# Patient Record
Sex: Male | Born: 1955
Health system: Southern US, Community
[De-identification: ages and names within clinical notes are randomized; demographics above are authoritative.]

## PROBLEM LIST (undated history)

## (undated) DIAGNOSIS — R233 Spontaneous ecchymoses: Secondary | ICD-10-CM

## (undated) DIAGNOSIS — R635 Abnormal weight gain: Secondary | ICD-10-CM

## (undated) DIAGNOSIS — E785 Hyperlipidemia, unspecified: Secondary | ICD-10-CM

## (undated) DIAGNOSIS — I2510 Atherosclerotic heart disease of native coronary artery without angina pectoris: Secondary | ICD-10-CM

## (undated) DIAGNOSIS — R238 Other skin changes: Secondary | ICD-10-CM

## (undated) HISTORY — DX: Other skin changes: R23.8

## (undated) HISTORY — DX: Abnormal weight gain: R63.5

## (undated) HISTORY — DX: Atherosclerotic heart disease of native coronary artery without angina pectoris: I25.10

## (undated) HISTORY — DX: Spontaneous ecchymoses: R23.3

## (undated) HISTORY — DX: Hyperlipidemia, unspecified: E78.5

---

## 2005-12-03 ENCOUNTER — Ambulatory Visit: Payer: Self-pay | Admitting: Family Medicine

## 2006-01-06 ENCOUNTER — Ambulatory Visit: Payer: Self-pay | Admitting: Family Medicine

## 2006-06-15 DIAGNOSIS — E785 Hyperlipidemia, unspecified: Secondary | ICD-10-CM

## 2006-11-09 ENCOUNTER — Encounter (INDEPENDENT_AMBULATORY_CARE_PROVIDER_SITE_OTHER): Payer: Self-pay | Admitting: Family Medicine

## 2007-01-12 ENCOUNTER — Ambulatory Visit: Payer: Self-pay | Admitting: Family Medicine

## 2007-01-12 DIAGNOSIS — F528 Other sexual dysfunction not due to a substance or known physiological condition: Secondary | ICD-10-CM

## 2007-01-12 DIAGNOSIS — K625 Hemorrhage of anus and rectum: Secondary | ICD-10-CM

## 2007-01-14 LAB — CONVERTED CEMR LAB
AST: 37 units/L (ref 0–37)
Basophils Absolute: 0 10*3/uL (ref 0.0–0.1)
Cholesterol: 262 mg/dL (ref 0–200)
Eosinophils Absolute: 0.1 10*3/uL (ref 0.0–0.6)
Glucose, Bld: 103 mg/dL — ABNORMAL HIGH (ref 70–99)
HDL: 36.4 mg/dL — ABNORMAL LOW (ref >39.0)
MCHC: 34.1 g/dL (ref 30.0–36.0)
MCV: 90.2 fL (ref 78.0–100.0)
Neutro Abs: 3.3 10*3/uL (ref 1.4–7.7)
Neutrophils Relative %: 54.9 % (ref 43.0–77.0)
Platelets: 174 10*3/uL (ref 150–400)
RBC: 4.51 M/uL (ref 4.22–5.81)
Triglycerides: 183 mg/dL — ABNORMAL HIGH (ref 0–149)

## 2007-01-15 ENCOUNTER — Telehealth (INDEPENDENT_AMBULATORY_CARE_PROVIDER_SITE_OTHER): Payer: Self-pay | Admitting: *Deleted

## 2007-01-25 ENCOUNTER — Ambulatory Visit: Payer: Self-pay | Admitting: Internal Medicine

## 2007-01-26 ENCOUNTER — Telehealth (INDEPENDENT_AMBULATORY_CARE_PROVIDER_SITE_OTHER): Payer: Self-pay | Admitting: *Deleted

## 2007-03-30 ENCOUNTER — Telehealth (INDEPENDENT_AMBULATORY_CARE_PROVIDER_SITE_OTHER): Payer: Self-pay | Admitting: *Deleted

## 2007-05-04 ENCOUNTER — Encounter (INDEPENDENT_AMBULATORY_CARE_PROVIDER_SITE_OTHER): Payer: Self-pay | Admitting: Family Medicine

## 2007-05-10 ENCOUNTER — Encounter (INDEPENDENT_AMBULATORY_CARE_PROVIDER_SITE_OTHER): Payer: Self-pay | Admitting: *Deleted

## 2007-10-27 ENCOUNTER — Ambulatory Visit: Payer: Self-pay | Admitting: Internal Medicine

## 2007-11-10 ENCOUNTER — Ambulatory Visit: Payer: Self-pay | Admitting: Internal Medicine

## 2007-11-10 ENCOUNTER — Encounter: Payer: Self-pay | Admitting: Internal Medicine

## 2007-11-19 ENCOUNTER — Encounter: Payer: Self-pay | Admitting: Internal Medicine

## 2008-06-07 ENCOUNTER — Ambulatory Visit: Payer: Self-pay | Admitting: Family Medicine

## 2008-06-12 ENCOUNTER — Telehealth (INDEPENDENT_AMBULATORY_CARE_PROVIDER_SITE_OTHER): Payer: Self-pay | Admitting: *Deleted

## 2008-06-12 LAB — CONVERTED CEMR LAB
ALT: 91 units/L — ABNORMAL HIGH (ref 0–53)
AST: 43 units/L — ABNORMAL HIGH (ref 0–37)
Alkaline Phosphatase: 93 units/L (ref 39–117)
Basophils Absolute: 0 10*3/uL (ref 0.0–0.1)
Bilirubin, Direct: 0.1 mg/dL (ref 0.0–0.3)
CO2: 29 meq/L (ref 19–32)
Calcium: 9.6 mg/dL (ref 8.4–10.5)
Chloride: 102 meq/L (ref 96–112)
Glucose, Bld: 102 mg/dL — ABNORMAL HIGH (ref 70–99)
Hemoglobin: 15 g/dL (ref 13.0–17.0)
LDL Cholesterol: 142 mg/dL — ABNORMAL HIGH (ref 0–99)
Lymphocytes Relative: 28.2 % (ref 12.0–46.0)
Monocytes Relative: 13.7 % — ABNORMAL HIGH (ref 3.0–12.0)
Neutro Abs: 3 10*3/uL (ref 1.4–7.7)
Neutrophils Relative %: 56.1 % (ref 43.0–77.0)
Potassium: 4.5 meq/L (ref 3.5–5.1)
RDW: 13.9 % (ref 11.5–14.6)
Sodium: 138 meq/L (ref 135–145)
Total Bilirubin: 1.3 mg/dL — ABNORMAL HIGH (ref 0.3–1.2)
Total CHOL/HDL Ratio: 6
Total Protein: 7.9 g/dL (ref 6.0–8.3)
Triglycerides: 102 mg/dL (ref 0–149)
VLDL: 20 mg/dL (ref 0–40)

## 2008-06-29 ENCOUNTER — Ambulatory Visit: Payer: Self-pay | Admitting: Family Medicine

## 2008-07-03 ENCOUNTER — Encounter (INDEPENDENT_AMBULATORY_CARE_PROVIDER_SITE_OTHER): Payer: Self-pay | Admitting: *Deleted

## 2008-07-03 LAB — CONVERTED CEMR LAB
ALT: 54 units/L — ABNORMAL HIGH (ref 0–53)
AST: 23 units/L (ref 0–37)
Albumin: 4 g/dL (ref 3.5–5.2)
Alkaline Phosphatase: 91 units/L (ref 39–117)
Indirect Bilirubin: 0.3 mg/dL (ref 0.0–0.9)
Total Protein: 7.3 g/dL (ref 6.0–8.3)

## 2008-11-02 DIAGNOSIS — R740 Nonspecific elevation of levels of transaminase and lactic acid dehydrogenase [LDH]: Secondary | ICD-10-CM

## 2010-01-17 ENCOUNTER — Ambulatory Visit: Payer: Self-pay | Admitting: Family Medicine

## 2010-01-17 DIAGNOSIS — L84 Corns and callosities: Secondary | ICD-10-CM | POA: Insufficient documentation

## 2010-01-17 DIAGNOSIS — D485 Neoplasm of uncertain behavior of skin: Secondary | ICD-10-CM

## 2010-01-18 ENCOUNTER — Telehealth (INDEPENDENT_AMBULATORY_CARE_PROVIDER_SITE_OTHER): Payer: Self-pay | Admitting: *Deleted

## 2010-01-18 LAB — CONVERTED CEMR LAB
ALT: 31 units/L (ref 0–53)
AST: 24 units/L (ref 0–37)
Albumin: 4 g/dL (ref 3.5–5.2)
BUN: 14 mg/dL (ref 6–23)
Chloride: 105 meq/L (ref 96–112)
Eosinophils Relative: 1.2 % (ref 0.0–5.0)
GFR calc non Af Amer: 78.83 mL/min (ref >60)
Glucose, Bld: 97 mg/dL (ref 70–99)
HCT: 42.6 % (ref 39.0–52.0)
HDL: 41.6 mg/dL (ref >39.00)
Hemoglobin: 14.6 g/dL (ref 13.0–17.0)
Lymphs Abs: 1.4 10*3/uL (ref 0.7–4.0)
MCV: 90.2 fL (ref 78.0–100.0)
Monocytes Absolute: 0.6 10*3/uL (ref 0.1–1.0)
Monocytes Relative: 9.4 % (ref 3.0–12.0)
Neutro Abs: 3.9 10*3/uL (ref 1.4–7.7)
PSA: 0.6 ng/mL (ref 0.10–4.00)
Platelets: 156 10*3/uL (ref 150.0–400.0)
Potassium: 4.7 meq/L (ref 3.5–5.1)
RDW: 15.9 % — ABNORMAL HIGH (ref 11.5–14.6)
Sodium: 141 meq/L (ref 135–145)
TSH: 1.88 microintl units/mL (ref 0.35–5.50)

## 2010-01-23 ENCOUNTER — Telehealth (INDEPENDENT_AMBULATORY_CARE_PROVIDER_SITE_OTHER): Payer: Self-pay | Admitting: *Deleted

## 2010-01-23 ENCOUNTER — Encounter (INDEPENDENT_AMBULATORY_CARE_PROVIDER_SITE_OTHER): Payer: Self-pay | Admitting: *Deleted

## 2010-03-31 HISTORY — PX: CORONARY STENT PLACEMENT: SHX1402

## 2010-04-25 ENCOUNTER — Encounter: Payer: Self-pay | Admitting: Family Medicine

## 2010-04-25 ENCOUNTER — Ambulatory Visit
Admission: RE | Admit: 2010-04-25 | Discharge: 2010-04-25 | Payer: Self-pay | Source: Home / Self Care | Attending: Cardiology | Admitting: Cardiology

## 2010-04-25 ENCOUNTER — Encounter: Payer: Self-pay | Admitting: Cardiology

## 2010-04-25 ENCOUNTER — Ambulatory Visit
Admission: RE | Admit: 2010-04-25 | Discharge: 2010-04-25 | Payer: Self-pay | Source: Home / Self Care | Attending: Family Medicine | Admitting: Family Medicine

## 2010-04-26 ENCOUNTER — Ambulatory Visit (HOSPITAL_COMMUNITY)
Admission: RE | Admit: 2010-04-26 | Discharge: 2010-04-27 | Payer: Self-pay | Source: Home / Self Care | Attending: Internal Medicine | Admitting: Internal Medicine

## 2010-04-26 LAB — CONVERTED CEMR LAB
Basophils Absolute: 0 10*3/uL (ref 0.0–0.1)
Basophils Relative: 0 % (ref 0–1)
CO2: 29 meq/L (ref 19–32)
Calcium: 9.5 mg/dL (ref 8.4–10.5)
Creatinine, Ser: 1.26 mg/dL (ref 0.40–1.50)
Eosinophils Absolute: 0.1 10*3/uL (ref 0.0–0.7)
INR: 0.91 (ref <1.50)
MCHC: 33.1 g/dL (ref 30.0–36.0)
MCV: 89.2 fL (ref 78.0–100.0)
Neutro Abs: 4 10*3/uL (ref 1.7–7.7)
Neutrophils Relative %: 56 % (ref 43–77)
Platelets: 168 10*3/uL (ref 150–400)
Prothrombin Time: 12.5 s (ref 11.6–15.2)
RDW: 14.8 % (ref 11.5–15.5)
Sodium: 140 meq/L (ref 135–145)

## 2010-04-26 LAB — POCT ACTIVATED CLOTTING TIME: Activated Clotting Time: 346 seconds

## 2010-04-27 LAB — BASIC METABOLIC PANEL
Calcium: 9.3 mg/dL (ref 8.4–10.5)
GFR calc Af Amer: >60 mL/min (ref >60)
GFR calc non Af Amer: 58 mL/min — ABNORMAL LOW (ref >60)
Sodium: 137 mEq/L (ref 135–145)

## 2010-04-27 LAB — CBC
HCT: 42.4 % (ref 39.0–52.0)
Hemoglobin: 14.2 g/dL (ref 13.0–17.0)
MCH: 29.8 pg (ref 26.0–34.0)
MCHC: 33.5 g/dL (ref 30.0–36.0)
MCV: 89.1 fL (ref 78.0–100.0)
Platelets: 155 10*3/uL (ref 150–400)
RBC: 4.76 MIL/uL (ref 4.22–5.81)
RDW: 15 % (ref 11.5–15.5)
WBC: 7.1 10*3/uL (ref 4.0–10.5)

## 2010-04-27 LAB — PLATELET INHIBITION P2Y12
P2Y12 % Inhibition: 0 %
Platelet Function  P2Y12: 229 [PRU] (ref 194–418)
Platelet Function Baseline: 221 [PRU] (ref 194–418)

## 2010-04-30 NOTE — Progress Notes (Signed)
Summary: clarification from pharmacy for Viagra refill  Phone Note Refill Request Message from:  Fax from Pharmacy on January 23, 2010 11:12 AM  Refills Requested: Medication #1:  VIAGRA 100 MG TABS 1 tab by mouth as needed received clarifaication fax from [facility]--states "attached prescriptions was faxed to [place].  We were unable to determine if fax came from your office. "     will take this paper over to [facility]  Initial call taken by: Jerolyn Shin,  January 23, 2010 11:16 AM  Follow-up for Phone Call        prescription faxed to [facility].......Marland KitchenDoristine Devoid CMA  January 23, 2010 2:33 PM     Prescriptions: VIAGRA 100 MG TABS (SILDENAFIL CITRATE) 1 tab by mouth as needed  #3 months x 3   Entered by:   Doristine Devoid CMA   Authorized by:   Neena Rhymes MD   Signed by:   Doristine Devoid CMA on 01/23/2010   Method used:   Reprint   RxID:   [id number]

## 2010-04-30 NOTE — Assessment & Plan Note (Signed)
Summary: cpx/fasting//kn   Vital Signs:  Patient profile:   55 year old male Height:      70 inches Weight:      214 pounds BMI:     30.82 Pulse rate:   80 / minute BP sitting:   124 / 84  (left arm)  Vitals Entered By: Doristine Devoid CMA (January 17, 2010 8:24 AM) CC: CPX AND LABS   History of Present Illness: 55 yo man here today for CPE.  no concerns about health.  UTD on colonoscopy.  Callous- on bottom of L foot.  will get painful if it overgrows.  is filing area regularly.  doesn't want area 'cut out'.  wants to know if there are other options.  Preventive Screening-Counseling & Management  Alcohol-Tobacco     Alcohol drinks/day: <1     Smoking Status: never  Caffeine-Diet-Exercise     Diet Comments: weight watchers     Does Patient Exercise: yes     Type of exercise: treadmill      Sexual History:  currently monogamous.        Drug Use:  never.    Current Medications (verified): 1)  Viagra 100 Mg Tabs (Sildenafil Citrate) .Marland Kitchen.. 1 Tab By Mouth As Needed 2)  Vitamin C 500 Mg Tabs (Ascorbic Acid) .... Take One Tablet Daily 3)  Vitamin D .... Take One Tablet Daily 4)  Multivitamins  Caps (Multiple Vitamin) .... 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  Past Medical History: Reviewed history from 06/15/2006 and no changes required. Hyperlipidemia  Past Surgical History: Reviewed history from 06/07/2008 and no changes required. none  Family History: Reviewed history from 06/07/2008 and no changes  required. Family History Hypertension mother, brother, sister- DM no hx of cancer  Social History: Reviewed history from 06/07/2008 and no changes required. 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    Physical Exam  General:  Well-developed,well-nourished,in no acute distress; alert,appropriate and cooperative throughout examination Head:  Normocephalic and atraumatic without obvious abnormalities. Eyes:  No corneal or conjunctival inflammation noted. EOMI. Perrla. Funduscopic exam benign, without hemorrhages, exudates or papilledema. Vision grossly normal. Ears:  External ear exam shows no significant lesions or deformities.  Otoscopic examination reveals clear canals, tympanic membranes are intact bilaterally without bulging, retraction, inflammation or discharge. Hearing is grossly normal bilaterally. Nose:  External nasal examination shows no deformity or inflammation. Nasal mucosa are pink and moist without lesions or exudates. Mouth:  Oral mucosa and oropharynx without lesions or exudates.  Teeth in good repair. Neck:  No deformities, masses, or tenderness noted. Lungs:  Normal respiratory effort, chest expands symmetrically. Lungs are clear to auscultation, no crackles or wheezes. Heart:  Normal rate and regular rhythm. S1 and S2 normal without gallop, murmur, click, rub or other extra sounds. Abdomen:  Bowel  sounds positive,abdomen soft and non-tender without masses, organomegaly or hernias noted. Rectal:  No external  abnormalities noted. Normal sphincter tone. No rectal masses or tenderness. Genitalia:  Testes bilaterally descended without nodularity, tenderness or masses. No scrotal masses or lesions. No penis lesions or urethral discharge. Prostate:  Prostate gland firm and smooth, no enlargement, nodularity, tenderness, mass, asymmetry or induration. Pulses:  +2 carotid, radial, DP Extremities:  No clubbing, cyanosis, edema, or deformity noted with normal full range of motion of all joints.   corn located on plantar surface of L foot- no evidence of infxn Neurologic:  No cranial nerve deficits noted. Station and gait are normal. Plantar reflexes are down-going bilaterally. DTRs are symmetrical throughout. Sensory, motor and coordinative functions appear intact. Skin:  multiple hyperpigmented, stuck-on appearing lesions Cervical Nodes:  No lymphadenopathy noted Axillary Nodes:  No palpable lymphadenopathy Inguinal Nodes:  No significant adenopathy Psych:  Cognition and judgment appear intact. Alert and cooperative with normal attention span and concentration. No apparent delusions, illusions, hallucinations   Impression & Recommendations:  Problem # 1:  PREVENTIVE HEALTH CARE (ICD-V70.0) Assessment Unchanged PE WNL.  check labs.  UTD on health maintainence.  anticipatory guidance provided. Orders: Venipuncture ([address]) TLB-Lipid Panel (80061-LIPID) TLB-BMP (Basic Metabolic Panel-BMET) (80048-METABOL) TLB-CBC Platelet - w/Differential (85025-CBCD) TLB-Hepatic/Liver Function Pnl (80076-HEPATIC) TLB-TSH (Thyroid Stimulating Hormone) (84443-TSH) TLB-PSA (Prostate Specific Antigen) (84153-PSA)  Problem # 2:  CORNS AND CALLUSES (ICD-700) Assessment: New pt w/ corn on bottom of foot.  given his reluctance to have this removed by cutting recommended Dr Margart Sickles corn removers.  Pt expresses understanding and is in agreement w/ this plan.  Problem # 3:  NEOPLASM, SKIN, UNCERTAIN BEHAVIOR  (ICD-238.2) Assessment: New  given multiple hyperpigmented lesions will refer to derm for complete skin check.  Pt expresses understanding and is in agreement w/ this plan.  Orders: Dermatology Referral (Derma)  Complete Medication List: 1)  Viagra 100 Mg Tabs (Sildenafil citrate) .Marland Kitchen.. 1 tab by mouth as needed 2)  Vitamin C 500 Mg Tabs (Ascorbic acid) .... Take one tablet daily 3)  Vitamin D  .... Take one tablet daily 4)  Multivitamins Caps (Multiple vitamin) .... Take one tablet daily  Patient Instructions: 1)  Follow up in 1 year or as needed 2)  Your exam looks great!  Keep up the good work! 3)  We'll call you with your dermatology appt 4)  We'll notify you of your lab results 5)  Call with any questions or concerns 6)  Have a great holiday season! Prescriptions: VIAGRA 100 MG TABS (SILDENAFIL CITRATE) 1 tab by mouth as needed  #3 months x 3   Entered and Authorized by:   Neena Rhymes MD   Signed by:   Neena Rhymes MD on 01/17/2010   Method used:   Print then Give to Patient   RxID:   [id number]    Orders Added: 1)  Venipuncture [[address]] 2)  TLB-Lipid Panel [80061-LIPID] 3)  TLB-BMP (Basic Metabolic Panel-BMET) [80048-METABOL] 4)  TLB-CBC Platelet - w/Differential [85025-CBCD] 5)  TLB-Hepatic/Liver Function Pnl [80076-HEPATIC] 6)  TLB-TSH (Thyroid Stimulating Hormone) [84443-TSH] 7)  TLB-PSA (Prostate Specific Antigen) [30865-HQI] 8)  Dermatology Referral [Derma] 9)  Est. Patient 40-64 years [[address]]

## 2010-04-30 NOTE — Progress Notes (Signed)
Summary: labs  Phone Note Outgoing Call   Call placed by: Doristine Devoid CMA,  January 18, 2010 3:16 PM Call placed to: Patient Summary of Call: Total choleserol and LDL are both mildly elevated.  2 options- continue to work on diet and exercise and recheck in 6 months or start Simvastatin 20mg  nightly and recheck LFTs in 6-8 weeks.  remainder of labs look good!  Follow-up for Phone Call        left message on machine .......Marland KitchenDoristine Devoid CMA  January 18, 2010 3:16 PM   Additional Follow-up for Phone Call Additional follow up Details #1::        discuss with patient, labs faxed at pt request to [phone].................Marland KitchenFelecia Deloach CMA  January 21, 2010 8:39 AM

## 2010-04-30 NOTE — Letter (Signed)
Summary: Primary Care Consult Scheduled Letter  [facility] [place]  [address] [place], [place] [address]   Phone: [phone]  Fax: [phone]      01/23/2010 MRN: [id number]  [facility] [address] [place], [place]  [address]    Dear Eric Rosales,    We have scheduled an appointment for you.  At the recommendation of Dr. Neena Rhymes, we have scheduled you a consult with Dr. Betsy Coder [facility] on 01-30-2010 at 11:10am.  Their address is [address], [address], [place] [address]. The office phone number is [phone].  If this appointment day and time is not convenient for you, please feel free to call the office of the doctor you are being referred to at the number listed above and reschedule the appointment.    It is important for you to keep your scheduled appointments. We are here to make sure you are given good patient care.   Thank you,    Renee, Patient Care Coordinator [place] [facility]

## 2010-05-02 NOTE — Assessment & Plan Note (Signed)
Summary: chest pain w/ exertion/cdj   Vital Signs:  Patient profile:   55 year old male Weight:      217 pounds BMI:     31.25 Pulse rate:   66 / minute BP sitting:   120 / 82  (left arm)  Vitals Entered By: Doristine Devoid CMA (April 25, 2010 1:48 PM) CC: chest pain w/ exercise and stops w/ rest    History of Present Illness: 55 yo man here today for exertional chest pain.  will develop pain in L side of chest after walking on the treadmill.  will have associated nausea- no vomiting.  pt initially thought pain was heartburn.  unable to walk as quickly as previously.  sxs started 12/19.  denies SOB.  pain has radiated to L arm while walking.  pain improves once he stops walking.  + family hx of CVA (mother).  pt anxious about sxs.  Allergies (verified): No Known Drug Allergies  Past History:  Past medical, surgical, family and social histories (including risk factors) reviewed, and no changes noted (except as noted below).  Past Medical History: Reviewed history from 06/15/2006 and no changes required. Hyperlipidemia  Past Surgical History: Reviewed history from 06/07/2008 and no changes required. none  Family History: Reviewed history from 06/07/2008 and no changes required. Family History Hypertension mother, brother, sister- DM no hx of cancer  Social History: Reviewed history from 06/07/2008 and no changes required. 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 Neck:  No deformities, masses, or tenderness noted. Lungs:  Normal respiratory effort, chest expands symmetrically. Lungs are clear to auscultation, no crackles or wheezes. Heart:  Normal rate and regular  rhythm. S1 and S2 normal without gallop, murmur, click, rub or other extra sounds. Pulses:  +2 carotid, radial, DP Extremities:  No clubbing, cyanosis, edema, or deformity noted   Impression & Recommendations:  Problem # 1:  CHEST PAIN (ICD-786.50) Assessment New pt's pain is classic cardiac chest pain.  in need of further w/u.  called Cardiology and spoke w/ Dr Myrtis Ser about best way to expedite stress test.  after discussing sxs and risk factors Dr Myrtis Ser will see pt in office today.  will start ASA and Metoprolol to decrease cardiac strain.  pt advised to stop exercising until further notice.  pt appreciative of efforts and to Dr Myrtis Ser for working him in.  will follow.  Complete Medication List: 1)  Viagra 100 Mg Tabs (Sildenafil citrate) .Marland Kitchen.. 1 tab by mouth as needed 2)  Vitamin C 500 Mg Tabs (Ascorbic acid) .... Take one tablet daily 3)  Vitamin D  .... Take one tablet daily 4)  Multivitamins Caps (Multiple vitamin) .... Take one tablet daily 5)  Metoprolol Tartrate 25 Mg Tabs (Metoprolol tartrate) .Marland Kitchen.. 1 tab by mouth two times a day  Patient Instructions: 1)  Please go to [address] [facility] to see Dr Myrtis Ser about your chest pain 2)  Start an Aspirin 325mg  daily 3)  We will start Toprol 25mg  two times a day to decrease the strain on your heart 4)  Stop exercising until told otherwise 5)  Hang in there! Prescriptions: METOPROLOL TARTRATE 25 MG TABS (METOPROLOL TARTRATE) 1 tab by mouth two times a day  #60 x 3  Entered and Authorized by:   Neena Rhymes MD   Signed by:   Neena Rhymes MD on 04/25/2010   Method used:   Electronically to        [facility] [phone][facility] (retail)       [address] [place], [place]  [address]       Ph: [phone] or [phone]       Fax: [phone]   RxID:   [id number]    Orders Added: 1)  Est. Patient Level III [[address]]

## 2010-05-02 NOTE — Letter (Signed)
Summary: Cardiac Catheterization Instructions- Main Lab  [facility], Main Office  [address]   [place], [place] [address]   Phone: [phone]  Fax: [phone]     04/25/2010 MRN: [id number]  [facility] [address] [place], [place]  [address]  Dear Eric Rosales,   You are scheduled for Cardiac Catheterization on  April 26, 2010             with Dr. Gala Romney.            .  Please arrive at the [facility] at 6:00       a.m. on the day of your procedure.  1. DIET     __X__ Nothing to eat or drink after midnight except your medications with a sip of water.  2. Come to the [place] office on (done today)            for lab work.  The lab at [facility] is open from 8:30 a.m. to 1:30 p.m. and 2:30 p.m. to 5:00 p.m.  The lab at [facility] is open from 7:30 a.m. to 5:30 p.m.  You do not have to be fasting.  3. MAKE SURE YOU TAKE YOUR ASPIRIN.  4.      YOU MAY TAKE ALL of your remaining medications with a small amount of water.   ____ Pre-med instructions:     _Take Plavix 75 mg and Aspirin 325 mg  by mouth morning of catheterization. _______________________________________________________________________________  5. Plan for one night stay - bring personal belongings (i.e. toothpaste, toothbrush, etc.)  6. Bring a current list of your medications and current insurance cards.  7. Must have a responsible person to drive you home.   8. Someone must be with you for the first 24 hours after you arrive home.  9. Please wear clothes that are easy to get on and off and wear slip-on shoes.  *Special note: Every effort is made to have your procedure done on time.  Occasionally there are emergencies that present themselves at the hospital that may cause delays.  Please be patient if a delay does occur.  If you have any questions after you get home, please call the office at the number listed  above.  Dossie Arbour, RN, BSN

## 2010-05-02 NOTE — Assessment & Plan Note (Signed)
Summary: chest pain/pla   Visit Type:  Initial Consult Primary Provider:  Neena Rhymes MD  CC:  chest pain.  History of Present Illness: The patient is seen today and add it on for the evaluation of chest pain.  I received a call from Dr.Tabori.  The patient was brought over to our office.  He tells me that in mid December he began having chest burning with exercise.  He cut back his exercise and felt better.  When he resumed his exercise again and it  became more marked. He did have some associated nausea with it.  He is pain-free today.  He does have hypercholesterolemia.  He does not smoke.  There is no evidence of diabetes.  There is not a strong family history of coronary disease.  As part of today's evaluation I have spoken with the patient's primary M.D. by telephone.  We have brought him over as an add-on to the schedule.  I have reviewed old records.  I compared all EKGs.  I communicated with the catheter lab to look into the timing for his possible catheterization.  I spent greater than one hour working with him and making her decisions.  Current Medications (verified): 1)  Viagra 100 Mg Tabs (Sildenafil Citrate) .Marland Kitchen.. 1 Tab By Mouth As Needed 2)  Vitamin C 500 Mg Tabs (Ascorbic Acid) .... Take One Tablet Daily 3)  Vitamin D .... Take One Tablet Daily 4)  Multivitamins  Caps (Multiple Vitamin) .... Take One Tablet Daily 5)  Metoprolol Tartrate 25 Mg Tabs (Metoprolol Tartrate) .Marland Kitchen.. 1 Tab By Mouth Two Times A Day  Allergies (verified): No Known Drug Allergies  Past History:  Past Medical History: Hyperlipidemia Abnormal EKG Chest pain.... classic angina with exertion  Review of Systems       Patient denies fever, chills, headache, sweats, rash, change in vision, change in hearing, chest pain, cough, nausea vomiting, urinary symptoms.  All other systems are reviewed and are negative.  Vital Signs:  Patient profile:   55 year old male Height:      70 inches Weight:       220 pounds BMI:     31.68 Pulse rate:   75 / minute BP sitting:   162 / 84  (left arm) Cuff size:   regular  Vitals Entered By: Hardin Negus, RMA (April 25, 2010 3:32 PM)  Physical Exam  General:  patient looked stable today. Head:  head is atraumatic. Eyes:  no xanthelasma. Neck:  no jugular venous distention. Chest Wall:  no chest wall tenderness. Lungs:  lungs are clear respiratory effort is not labored. Heart:  cardiac exam reveals S1 and S2.  No clicks or significant murmurs. Abdomen:  abdomen is soft. Msk:  no musculoskeletal deformities. Extremities:  no peripheral edema. Skin:  no skin rashes. Psych:  patient is oriented to person time and place.  Affect is normal.   Impression & Recommendations:  Problem # 1:  HYPERLIPIDEMIA (ICD-272.4) THE patient has hyperlipidemia.  He is being treated.  Problem # 2:  ABNORMAL ELECTROCARDIOGRAM (ICD-794.31) The EKG today is abnormal.  He has inverted anterolateral T waves.  I have obtained his study from March 2010.  He had these T-wave changes in the past.  He has never had an evaluation.  Problem # 3:  CHEST PAIN (ICD-786.50)  The following medications were removed from the medication list:    Metoprolol Tartrate 25 Mg Tabs (Metoprolol tartrate) .Marland Kitchen... 1 tab by mouth two times a day  His updated medication list for this problem includes:    Aspirin 325 Mg Tabs (Aspirin) .Marland Kitchen... Take one by mouth daily  Orders: Cardiac Catheterization (Cardiac Cath) T-CBC w/Diff [id number]) T-Basic Metabolic Panel [id number]) T-Protime, Auto ([id number]) I'm very concerned about the patient's presenting chest pain.  He has significant EKG changes.  It appears that these have been present in the past.  He is never had an echo.  He now gives a history of very significant exertional angina.  I carefully considered whether she should undergo exercise testing or cardiac catheterization.  With his very strong history and these EKG changes I  believe it is most prudent to proceed with catheterization.  I had a very long discussion with him about this.  He is in agreement  Patient Instructions: 1)  Your physician has requested that you have a cardiac catheterization.  Cardiac catheterization is used to diagnose and/or treat various heart conditions. Doctors may recommend this procedure for a number of different reasons. The most common reason is to evaluate chest pain. Chest pain can be a symptom of coronary artery disease (CAD), and cardiac catheterization can show whether plaque is narrowing or blocking your heart's arteries. This procedure is also used to evaluate the valves, as well as measure the blood flow and oxygen levels in different parts of your heart.  For further information please visit [email or web address]  Please follow instruction sheet, as given.  Appended Document: chest pain/pla    Clinical Lists Changes        Medication Administration  Medication # 1:    Medication: plavix    Dose: 150 mg    Route: po    Exp Date: 05/29/2012    Lot #: [id number]    Mfr: [employer]    Patient tolerated medication without complications    Given by: Dossie Arbour, RN, BSN (April 25, 2010 5:34 PM)

## 2010-05-20 NOTE — Discharge Summary (Signed)
  NAME:  Eric Rosales, Eric Rosales                   ACCOUNT NO.:  [id number]  MEDICAL RECORD NO.:  [id number]          PATIENT TYPE:  OIB  LOCATION:  6526                         FACILITY:  [facility]  PHYSICIAN:  Duke Salvia, MD, FACCDATE OF BIRTH:  [birth year]  DATE OF ADMISSION:  04/26/2010 DATE OF DISCHARGE:  04/27/2010                              DISCHARGE SUMMARY   PROCEDURES: 1. Cardiac catheterization. 2. Coronary arteriogram. 3. Left ventriculogram. 4. Percutaneous transluminal coronary angioplasty and 3.5 x 20 Promus     drug-eluting stent. 5. Two-view chest x-ray.  PRIMARY FINAL DISCHARGE DIAGNOSIS:  Progressive anginal pain.  SECONDARY DIAGNOSES: 1. Hyperlipidemia. 2. Abnormal EKG.  TIME OF DISCHARGE:  44 minutes.  HOSPITAL COURSE:  Eric Rosales is a 55 year old male with no previous history of coronary artery disease.  He was seen by his primary care physician, and he had had multiple episodes of chest pain, all with exertion.  His symptoms were concerning for progressive anginal pain, so he was seen in the office and referred for cath.  The cardiac catheterization showed a dominant LAD that had 95% stenosis at the takeoff of the first diagonal.  The circumflex and RCA systems had no significant lesions and his EF was 60%.  He had percutaneous intervention to the LAD with the drug-eluting stent.  He tolerated the procedure well.  Eric Rosales was seen by Cardiac Rehab.  He had a P2Y12 test that showed no inhibition and will be switched to Effient.  His postprocedure labs were stable.  He has no history of tobacco use and will exercise as directed. On April 27, 2010, Eric Rosales is ambulating without chest pain or shortness of breath and considered stable for discharge, to follow up as an outpatient.  DISCHARGE INSTRUCTIONS: 1. His activity level is to be increased gradually. 2. He is to call our office for problems with the cath site. 3. He is to follow up with Dr. Myrtis Ser and we  will call him with an     appointment. 4. He is to follow up with Dr. Beverely Low as needed or as scheduled.  Discharge medications will be dictated separately.     Theodore Demark, PA-C   ______________________________ Duke Salvia, MD, [facility]    RB/MEDQ  D:  04/27/2010  T:  04/28/2010  Job:  [id number]  cc:   Neena Rhymes, M.D. Luis Abed, MD, [facility]  Electronically Signed by Theodore Demark PA-C on 05/01/2010 02:52:26 PM Electronically Signed by Sherryl Manges MD [facility] on 05/20/2010 09:52:45 PM

## 2010-05-20 NOTE — Discharge Summary (Signed)
  NAME:  GEARLD, KERSTEIN                   ACCOUNT NO.:  [id number]  MEDICAL RECORD NO.:  [id number]          PATIENT TYPE:  OIB  LOCATION:  6526                         FACILITY:  [facility]  PHYSICIAN:  Duke Salvia, MD, FACCDATE OF BIRTH:  February 28, 1956  DATE OF ADMISSION:  04/26/2010 DATE OF DISCHARGE:  04/27/2010                              DISCHARGE SUMMARY   ADDENDUM  This is an addendum to the previously dictated discharge summary with medications. 1. Multivitamin daily. 2. Aspirin 325 mg a day. 3. Viagra p.r.n., do not use if nitroglycerin has been used within 24     hours. 4. Plavix is discontinued. 5. Vitamin C 500 mg daily. 6. Vitamin D 1000 units daily. 7. Simvastatin 40 mg daily. 8. Prasugrel 10 mg daily. 9. Work note putting him out until Wednesday, May 01, 2010.     Theodore Demark, PA-C   ______________________________ Duke Salvia, MD, [facility]    RB/MEDQ  D:  04/27/2010  T:  04/28/2010  Job:  [id number]  cc:   Neena Rhymes, M.D.  Electronically Signed by Theodore Demark PA-C on 05/01/2010 02:52:45 PM Electronically Signed by Sherryl Manges MD [facility] on 05/20/2010 09:52:51 PM

## 2010-05-22 ENCOUNTER — Encounter: Payer: Self-pay | Admitting: Cardiology

## 2010-05-22 NOTE — Procedures (Signed)
  NAME:  ADARRYL, GOLDAMMER                   ACCOUNT NO.:  [id number]  MEDICAL RECORD NO.:  [id number]          PATIENT TYPE:  OIB  LOCATION:  6526                         FACILITY:  [facility]  PHYSICIAN:  Arturo Morton. Riley Kill, MD, FACCDATE OF BIRTH:  12-03-1955  DATE OF PROCEDURE:  04/26/2010 DATE OF DISCHARGE:                           CARDIAC CATHETERIZATION   INDICATIONS:  Mr. Eric Rosales presented with chest pain.  He was admitted and underwent cath by Dr. Gala Romney.  This demonstrated a high-grade stenosis in the proximal left anterior descending artery.  Dr. Myrtis Ser and Dr. Gala Romney spoke with the patient and recommended percutaneous intervention.  I then spoke with the patient in detail.  He was loaded with Plavix.  PROCEDURE:  Percutaneous stenting of proximal left anterior descending artery.  DESCRIPTION OF PROCEDURE:  The procedure was performed from the right radial artery.  A 5-French sheath was exchanged for a 6-French sheath. Bivalirudin was given according to protocol.  Notably, we had some difficulty getting a guide to sit upward into the LAD.  This would be better approached from the groin on any followup studies due to the upward takeoff of the LAD.  We first tried XB 3.5, then an XB 3, subsequently a 3-0 guide.  Using a support wire in the circumflex, we were able to get a wire up into the LAD and ultimately pre-dilate. Stenting was done with a 3.5 x 20 Promus Element stent and postdilated with a 4-mm noncompliant balloon throughout with a peak pressure in the central portion of the lesion to 16 atmospheres.  There was no complications.  Excellent angiographic result was obtained.  A TR band was placed.  He was taken to the holding area in satisfactory clinical condition.  ANGIOGRAPHIC DATA:  The proximal left anterior descending artery demonstrates about a 90% stenosis.  Following stenting, this is reduced to 0%.  There does not appear to be edge tear and there appears to be good  stent expansion.  There were no major complications.  Dual antiplatelet therapy for approximately 1 year at minimum would be recommended, after that will be at discretion of the cardiologist.     Arturo Morton. Riley Kill, MD, [facility]     TDS/MEDQ  D:  04/26/2010  T:  04/27/2010  Job:  [id number]  cc:   Luis Abed, MD, [facility] Bevelyn Buckles. Bensimhon, MD CV Laboratory  Electronically Signed by Shawnie Pons MD [facility] on 05/22/2010 09:08:12 PM

## 2010-05-22 NOTE — Procedures (Signed)
  NAME:  Eric Rosales, Eric Rosales                   ACCOUNT NO.:  [id number]  MEDICAL RECORD NO.:  [id number]          PATIENT TYPE:  OIB  LOCATION:  6526                         FACILITY:  [facility]  PHYSICIAN:  Bevelyn Buckles. 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  He was referred to Dr. Henrietta Hoover office yesterday due to exertional angina concerning for unstable angina.  He was brought in today for cardiac catheterization.  PROCEDURES PERFORMED: 1. Selective coronary angiography. 2. Left heart catheterization. 3. Left ventriculogram.  DESCRIPTION OF PROCEDURE:  The risks and indication were explained. Consent was signed and placed on the chart.  After confirmation of normal Allen test, right wrist area was prepped and draped in routine sterile fashion and anesthetized with 1% local lidocaine.  A 5-French arterial sheath was placed using modified Seldinger technique.  3 mg of intra-arterial verapamil were given and 4500 units of systemic heparin were given.  Standard catheters including JL-3.5 and JR-4 were used as well as an angled pigtail catheterizations, all catheters were exchanged over wire.  There were no apparent complications.  FINDINGS:  Left main was very short and angiographically normal.  LAD was a long vessel wrapping the apex.  It gave off proximal diagonal, which was moderate size and several septal perforators.  At the proximal end of the LAD just at the takeoff of the first diagonal, there was a high-grade 95% stenosis.  There was TIMI 3 flow.  Left circ came off right near the ostium of the left main.  It gave off a large marginal branch and the distal AV groove circ was moderate in size.  Right coronary artery was a large dominant vessel, gave off a large branching RV branch  and a PDA and was angiographically normal.  Left ventriculogram done in the RAO position showed an ejection fraction of 60% with no regional wall motion abnormalities.  ASSESSMENT: 1. Severe one-vessel coronary artery disease with high-grade proximal     left anterior descending lesion. 2. Normal left ventricular function.  PLAN/DISCUSSION:  Given the fact that the circumflex comes off so proximally off the left main, I think there is likely room to treat the LAD lesion percutaneously.  I have reviewed it with Dr. Riley Kill who agrees.  We will plan for percutaneous intervention on the LAD today.     Bevelyn Buckles. Bensimhon, MD     DRB/MEDQ  D:  04/26/2010  T:  04/27/2010  Job:  [id number]  Electronically Signed by Arvilla Meres MD on 05/22/2010 01:54:34 PM

## 2010-05-24 ENCOUNTER — Ambulatory Visit (INDEPENDENT_AMBULATORY_CARE_PROVIDER_SITE_OTHER): Payer: BC Managed Care – PPO | Admitting: Cardiology

## 2010-05-24 ENCOUNTER — Encounter: Payer: Self-pay | Admitting: Cardiology

## 2010-05-24 DIAGNOSIS — I2510 Atherosclerotic heart disease of native coronary artery without angina pectoris: Secondary | ICD-10-CM

## 2010-05-28 NOTE — Miscellaneous (Signed)
  Clinical Lists Changes  Problems: Added new problem of CAD (ICD-414.00) Observations: Added new observation of PAST MED HX: Hyperlipidemia Abnormal EKG CAD   Unstable angina.Marland KitchenMarland Kitchen1/27/2012....cath/ DES proximal dominent LAD at Dx takeoff..(.P2Y12-no inhibition by plavix).Marland KitchenMarland KitchenEffient used  (05/22/2010 16:50) Added new observation of PRIMARY MD: Neena Rhymes MD (05/22/2010 16:50)       Past History:  Past Medical History: Hyperlipidemia Abnormal EKG CAD   Unstable angina.Marland KitchenMarland Kitchen1/27/2012....cath/ DES proximal dominent LAD at Dx takeoff..(.P2Y12-no inhibition by plavix).Marland KitchenMarland KitchenEffient used

## 2010-05-28 NOTE — Assessment & Plan Note (Addendum)
Summary: eph   Visit Type:  post hospital visit Primary Provider:  Neena Rhymes MD  CC:  CAD.  History of Present Illness: The patient is seen for a post hospital visit.  I met with him the first time on April 25, 2010.  I was very concerned about his symptoms of burning with exercise.  We arrange for catheterization and he had a tight proximal LAD stenosis after the diagonal takeoff.  He received a drug-eluting stent.  His platelet study showed no inhibition by Plavix and he is on effient.  He has returned to a treadmill and is doing very well.  He is not having any chest burning.  As part of the evaluation today I have reviewed his hospital discharge summary leg.  I reviewed his catheter report at length.  I've had a very long and careful discussion with him about all of his medications.  Current Medications (verified): 1)  Viagra 100 Mg Tabs (Sildenafil Citrate) .Marland Kitchen.. 1 Tab By Mouth As Needed 2)  Vitamin C 500 Mg Tabs (Ascorbic Acid) .... Take One Tablet Daily 3)  Vitamin D .... Take One Tablet Daily 4)  Multivitamins  Caps (Multiple Vitamin) .... Take One Tablet Daily 5)  Aspirin 325 Mg Tabs (Aspirin) .... Take One By Mouth Daily 6)  Effient 10 Mg Tabs (Prasugrel Hcl) .... Take One Tablet By Mouth Daily 7)  Simvastatin 40 Mg Tabs (Simvastatin) .... Take One Tablet By Mouth Daily At Bedtime  Allergies (verified): No Known Drug Allergies  Past History:  Past Medical History: Last updated: 05/22/2010 Hyperlipidemia Abnormal EKG CAD   Unstable angina.Marland KitchenMarland Kitchen1/27/2012....cath/ DES proximal dominent LAD at Dx takeoff..(.P2Y12-no inhibition by plavix).Marland KitchenMarland KitchenEffient used  Review of Systems       Patient denies fever, chills, headache, sweats, rash, change in vision, change in hearing, chest pain, cough, nausea vomiting, urinary symptoms.  All other systems are reviewed and are negative  Vital Signs:  Patient profile:   55 year old male Height:      70 inches Weight:      215  pounds BMI:     30.96 Pulse rate:   65 / minute BP sitting:   144 / 66  (left arm) Cuff size:   regular  Vitals Entered By: Hardin Negus, RMA (May 24, 2010 3:14 PM)  Physical Exam  General:  patient looks quite good. Head:  head is atraumatic Eyes:  no xanthelasma. Neck:  no jugular venous distention. Chest Wall:  no chest wall tenderness. Lungs:  lungs are clear.  Respiratory effort is nonlabored. Heart:  cardiac exam reveals S1-S2.  No clicks or significant murmurs. Abdomen:  abdomen is soft. Msk:  no musculoskeletal deformities. Extremities:  radial catheter site is nicely healed. Skin:  no skin rash. Psych:  patient is oriented to person time and place.  Affect is normal.   Impression & Recommendations:  Problem # 1:  CAD (ICD-414.00) The patient had a new diagnosis of a tight LAD lesion.  This has now been treated and he is doing very well.  I discussed all of his medications with him.  We will arrange for early followup of his cholesterol.  We want his LDL at 70 or lower.  He knows not to stop his antiplatelet medications.  I have encouraged him not to use Viagra for a while but this is not contraindicated.  I instructed him that he must never use it in conjunction with nitroglycerin.  He did not have an MI.  He does  not need a beta blocker.  He knows to use Tylenol or aspirin for headache.  He can use a rare dose of a nonsteroidal med if needed.  EKG is done today and reviewed by me.  It is similar to his prior tracings.  In addition to reviewing his hospital records and updating his chart, I spent greater than 30 minutes talking with him about his overall care.  Problem # 2:  HYPERLIPIDEMIA (ICD-272.4)  His updated medication list for this problem includes:    Simvastatin 40 Mg Tabs (Simvastatin) .Marland Kitchen... Take one tablet by mouth daily at bedtime The patient left followup lipid studies.  I will see him back for followup.  Patient Instructions: 1)  Your physician  recommends that you schedule a follow-up appointment in: 07-01-10 at 4:00 pm with Dr. Myrtis Ser 2)  Your physician recommends that you return for a FASTING lipid profile:  06-11-10 lab opens at 8:30 am 3)  Your physician recommends that you continue on your current medications as directed. Please refer to the Current Medication list given to you today. 4)  Your physician recommended you take 1 tablet (or 1 spray) under tongue at onset of chest pain; you may repeat every 5 minutes for up to 3 doses. If 3 or more doses are required, call 911 and proceed to the ER immediately. Prescriptions: SIMVASTATIN 40 MG TABS (SIMVASTATIN) Take one tablet by mouth daily at bedtime  #30 x 3   Entered by:   Lisabeth Devoid RN   Authorized by:   Talitha Givens, MD, [facility]   Signed by:   Lisabeth Devoid RN on 05/24/2010   Method used:   Electronically to        [facility]  [address] [phone][facility] (retail)       [address]       [place], [place]  [address]       Ph: [phone] or [phone]       Fax: [phone]   RxID:   [id number] EFFIENT 10 MG TABS (PRASUGREL HCL) Take one tablet by mouth daily  #30 x 3   Entered by:   Lisabeth Devoid RN   Authorized by:   Talitha Givens, MD, [facility]   Signed by:   Lisabeth Devoid RN on 05/24/2010   Method used:   Electronically to        [facility]  [address] [phone]* (retail)       [address] [place], [place]  [address]       Ph: [phone] or [phone]       Fax: [phone]   RxID:   [id number] NITROSTAT 0.4 MG SUBL (NITROGLYCERIN) 1 tablet under tongue at onset of chest pain; you may repeat every 5 minutes for up to 3 doses.  #25 x 6   Entered by:   Lisabeth Devoid RN   Authorized by:   Talitha Givens, MD, [facility]   Signed by:   Lisabeth Devoid RN on 05/24/2010   Method used:   [facility]ically [facility]        [facility]  [address] [phone]* (retail)       [address]       [place], [place]  [address]       Ph: [phone] or [phone]       Fax: [phone]  RxID:    [id number]

## 2010-06-05 ENCOUNTER — Encounter (INDEPENDENT_AMBULATORY_CARE_PROVIDER_SITE_OTHER): Payer: Self-pay | Admitting: *Deleted

## 2010-06-11 ENCOUNTER — Other Ambulatory Visit: Payer: Self-pay | Admitting: Cardiology

## 2010-06-11 ENCOUNTER — Encounter: Payer: Self-pay | Admitting: Cardiology

## 2010-06-11 ENCOUNTER — Other Ambulatory Visit (INDEPENDENT_AMBULATORY_CARE_PROVIDER_SITE_OTHER): Payer: BC Managed Care – PPO

## 2010-06-11 DIAGNOSIS — E785 Hyperlipidemia, unspecified: Secondary | ICD-10-CM

## 2010-06-11 LAB — LIPID PANEL
Cholesterol: 141 mg/dL (ref 0–200)
HDL: 36.7 mg/dL — ABNORMAL LOW (ref >39.00)
Triglycerides: 96 mg/dL (ref 0.0–149.0)

## 2010-06-11 LAB — HEPATIC FUNCTION PANEL
ALT: 49 U/L (ref 0–53)
AST: 33 U/L (ref 0–37)
Total Bilirubin: 0.7 mg/dL (ref 0.3–1.2)
Total Protein: 7.1 g/dL (ref 6.0–8.3)

## 2010-06-11 NOTE — Letter (Signed)
Summary: Appointment - Reschedule  [facility], Main Office  [address]   [place], [place] [address]   Phone: [phone]  Fax: [phone]     June 05, 2010 MRN: [id number]   [facility] [address] [place], [place]  [address]   Dear Mr. PRO,   Due to a change in our office schedule, your appointment on Apirl 2,2012 at 4:00 must be changed.  It is very important that we reach you to reschedule this appointment. We look forward to participating in your health care needs. Please contact us at the number listed above at your earliest convenience to reschedule this appointment.     Sincerely, Control and instrumentation engineer

## 2010-06-14 ENCOUNTER — Telehealth: Payer: Self-pay | Admitting: Cardiology

## 2010-06-14 ENCOUNTER — Telehealth: Payer: Self-pay | Admitting: *Deleted

## 2010-06-17 ENCOUNTER — Telehealth: Payer: Self-pay | Admitting: Internal Medicine

## 2010-06-18 ENCOUNTER — Telehealth: Payer: Self-pay | Admitting: Internal Medicine

## 2010-06-18 NOTE — Progress Notes (Signed)
Summary: CALLING FOR TEST RESULTS  Phone Note Call from Patient Call back at [phone]   Caller: Patient Summary of Call: PT CALLING FOR TEST RESULTS WANT TO KNOW IF THEY CAN BE FAX TO THIS NUMBER [phone] Initial call taken by: Judie Grieve,  June 14, 2010 9:09 AM  Follow-up for Phone Call        pt given results and copy faxed Meredith Staggers, RN  June 14, 2010 9:30 AM

## 2010-06-18 NOTE — Telephone Encounter (Signed)
Pt returning Eric Rosales re meds.

## 2010-06-19 ENCOUNTER — Encounter: Payer: Self-pay | Admitting: Cardiology

## 2010-06-19 NOTE — Telephone Encounter (Signed)
Left message for pt that med sent via phone  [facility]. Informed if not right for pt to call back with correct info.

## 2010-06-27 NOTE — Progress Notes (Signed)
Summary: returning your call  Phone Note Outgoing Call   Call placed by: Scherrie Bateman, LPN,  June 14, 2010 12:14 PM Call placed to: Patient Summary of Call: LEFT MESSAGE FOR Eric Rosales TO CALL BACK RECIEVED FAX FROM [facility] FOR REFILLS ON 06/07/10  [facility] WHICH PHARMACY IS CORRECT.AWAITING RETURN CALL FROM Eric Rosales FOR RESPONSE. Initial call taken by: Scherrie Bateman, LPN,  June 14, 2010 12:18 PM  Follow-up for Phone Call        [facility] Scherrie Bateman, LPN  June 17, 2010 8:51 AM   Additional Follow-up for Phone Call Additional follow up Details #1::        Eric Rosales returning your call Eric Rosales #[phone] ext [phone] Additional Follow-up by: Roe Coombs,  June 17, 2010 1:06 PM    Additional Follow-up for Phone Call Additional follow up Details #2::    lmtcb Scherrie Bateman, LPN  June 17, 2010 1:33 PM   MED FILLED VIA PHONE TO [facility]. Follow-up by: Scherrie Bateman, LPN,  June 19, 2010 8:32 AM

## 2010-06-27 NOTE — Progress Notes (Signed)
Summary: refill  Phone Note Refill Request Message from:  Pharmacy on June 17, 2010 1:01 PM  Refills Requested: Medication #1:  SIMVASTATIN 40 MG TABS Take one tablet by mouth daily at bedtime [facility] [phone] 90 days supply up to tthree refills case # [id number]  Initial call taken by: Judie Grieve,  June 17, 2010 1:02 PM  Follow-up for Phone Call        Gave verbal order for simvastatin to pharmacy. Marrion Coy, CNA  June 17, 2010 3:14 PM  Follow-up by: Marrion Coy, CNA,  June 17, 2010 3:14 PM

## 2010-07-01 ENCOUNTER — Ambulatory Visit: Payer: BC Managed Care – PPO | Admitting: Cardiology

## 2010-07-09 ENCOUNTER — Other Ambulatory Visit (INDEPENDENT_AMBULATORY_CARE_PROVIDER_SITE_OTHER): Payer: BC Managed Care – PPO | Admitting: *Deleted

## 2010-07-09 DIAGNOSIS — E7800 Pure hypercholesterolemia, unspecified: Secondary | ICD-10-CM

## 2010-07-09 LAB — LIPID PANEL
Cholesterol: 155 mg/dL (ref 0–200)
Total CHOL/HDL Ratio: 4

## 2010-07-10 ENCOUNTER — Ambulatory Visit (INDEPENDENT_AMBULATORY_CARE_PROVIDER_SITE_OTHER): Payer: BC Managed Care – PPO | Admitting: Cardiology

## 2010-07-10 ENCOUNTER — Encounter: Payer: Self-pay | Admitting: Cardiology

## 2010-07-10 DIAGNOSIS — I2510 Atherosclerotic heart disease of native coronary artery without angina pectoris: Secondary | ICD-10-CM

## 2010-07-10 DIAGNOSIS — E785 Hyperlipidemia, unspecified: Secondary | ICD-10-CM

## 2010-07-10 NOTE — Progress Notes (Signed)
HPI Patient is seen for followup of coronary disease.  He received a drug-eluting stent to the LAD April 26, 2010.  He's done very well.  Following his lipids carefully  No Known Allergies  Current Outpatient Prescriptions  Medication Sig Dispense Refill  . Ascorbic Acid (VITAMIN C) 500 MG tablet Take 500 mg by mouth daily.        Marland Kitchen aspirin 325 MG tablet Take 325 mg by mouth daily.        . Multiple Vitamin (MULTIVITAMIN) capsule Take 1 capsule by mouth daily.        . nitroGLYCERIN (NITROSTAT) 0.4 MG SL tablet Place 0.4 mg under the tongue every 5 (five) minutes as needed.        . prasugrel (EFFIENT) 10 MG TABS Take 10 mg by mouth daily.        . sildenafil (VIAGRA) 100 MG tablet Take 100 mg by mouth daily as needed.        . simvastatin (ZOCOR) 40 MG tablet Take 40 mg by mouth at bedtime.        Marland Kitchen DISCONTD: NON FORMULARY Vitamin D once daily          History   Social History  . Marital Status: Married    Spouse Name: N/A    Number of Children: N/A  . Years of Education: N/A   Occupational History  . Not on file.   Social History Main Topics  . Smoking status: Never Smoker   . Smokeless tobacco: Not on file  . Alcohol Use: 0.6 oz/week    1 Glasses of wine per week  . Drug Use: No  . Sexually Active: Not on file   Other Topics Concern  . Not on file   Social History Narrative  . No narrative on file    Family History  Problem Relation Age of Onset  . Hypertension    . Diabetes Mother   . Diabetes Sister   . Diabetes Brother     Past Medical History  Diagnosis Date  . Hyperlipidemia   . Abnormal EKG   . CAD (coronary artery disease)     Unstable angina January, 2012, DES proximal dominant LAD at the diagonal takeoff ( P2Y12-no inhibition by Plavix) Effient used    No past surgical history on file.  ROS  Patient denies fever, chills, headache, sweats, rash, change in vision, change in hearing, chest pain, cough, nausea vomiting, urinary symptoms.  All  other systems are reviewed and are negative.  PHYSICAL EXAM Patient looks quite good.  He is oriented to person time and place.  Affect is normal.  There is no xanthelasma.  Lungs are clear.  Respiratory effort is not labored.  There is no jugular venous distention.  Cardiac exam reveals S1-S2.  No clicks or significant murmurs.  The abdomen is soft there is no peripheral edema.  Filed Vitals:   07/10/10 1616  BP: 146/92  Pulse: 78  Resp: 18  Height: 5\' 10"  (1.778 m)  Weight: 216 lb 1.9 oz (98.031 kg)    EKG Not done today  ASSESSMENT & PLAN

## 2010-07-10 NOTE — Assessment & Plan Note (Signed)
The patient's LDL is now down to 80 and we will keep his simvastatin at 40 mg.  He's had some intermittent elevation of his triglycerides.  I have recommended fish oil.

## 2010-07-10 NOTE — Patient Instructions (Signed)
Your physician wants you to follow-up in:  6 months. You will receive a reminder letter in the mail two months in advance. If you don't receive a letter, please call our office to schedule the follow-up appointment.   

## 2010-07-10 NOTE — Assessment & Plan Note (Signed)
The patient's coronary status is quite stable.  He did have some mild chest discomfort recently but it clearly sounds musculoskeletal.  He is doing a good job with his diet and exercise.

## 2011-01-06 ENCOUNTER — Encounter: Payer: Self-pay | Admitting: Cardiology

## 2011-01-07 ENCOUNTER — Encounter: Payer: Self-pay | Admitting: Cardiology

## 2011-01-07 ENCOUNTER — Ambulatory Visit (INDEPENDENT_AMBULATORY_CARE_PROVIDER_SITE_OTHER): Payer: BC Managed Care – PPO | Admitting: Cardiology

## 2011-01-07 DIAGNOSIS — R233 Spontaneous ecchymoses: Secondary | ICD-10-CM | POA: Insufficient documentation

## 2011-01-07 DIAGNOSIS — R238 Other skin changes: Secondary | ICD-10-CM | POA: Insufficient documentation

## 2011-01-07 DIAGNOSIS — R635 Abnormal weight gain: Secondary | ICD-10-CM

## 2011-01-07 DIAGNOSIS — I2510 Atherosclerotic heart disease of native coronary artery without angina pectoris: Secondary | ICD-10-CM

## 2011-01-07 DIAGNOSIS — E785 Hyperlipidemia, unspecified: Secondary | ICD-10-CM

## 2011-01-07 MED ORDER — ASPIRIN 81 MG PO TABS: 81.0000 mg | ORAL_TABLET | Freq: Every day | ORAL | Status: AC

## 2011-01-07 NOTE — Assessment & Plan Note (Addendum)
The patient has had some easy bruisability.  He has had no frank bleeding.  I will decrease his aspirin to 81 mg.  He continues on Effient.  We will make a decision about stopping this in February, 2013. Blood count and platelet count will be checked when he has his lipids checked

## 2011-01-07 NOTE — Assessment & Plan Note (Signed)
At this time coronary disease is stable.  As outlined above his EKG remains abnormal and it has not changed.  He is not having any significant symptoms.  Blood pressure is controlled.  His lipids were nicely controlled in April, 2012.  However he is gaining weight and I want to be sure that we are being aggressive with his treatment.  A repeat fasting lipid will be obtained.

## 2011-01-07 NOTE — Progress Notes (Signed)
HPI Patient is seen today for followup coronary artery disease.  Patient received a drug-eluting stent to the LAD in January, 2012.  He has not had any return of his symptoms.  At that time he felt pressure in his chest and shortness of breath when walking on the treadmill.  He has not had any of this.  He has not had syncope or presyncope.  He has gained some weight and we talked about this carefully.  He also has had some easy bruisability on his dual lead Antiplatelet therapy.   No Known Allergies  Current Outpatient Prescriptions  Medication Sig Dispense Refill  . Ascorbic Acid (VITAMIN C) 500 MG tablet Take 500 mg by mouth daily.        Marland Kitchen aspirin 325 MG tablet Take 325 mg by mouth daily.        . Multiple Vitamin (MULTIVITAMIN) capsule Take 1 capsule by mouth daily.        . nitroGLYCERIN (NITROSTAT) 0.4 MG SL tablet Place 0.4 mg under the tongue every 5 (five) minutes as needed.        . prasugrel (EFFIENT) 10 MG TABS Take 10 mg by mouth daily.        . sildenafil (VIAGRA) 100 MG tablet Take 100 mg by mouth daily as needed.        . simvastatin (ZOCOR) 40 MG tablet Take 40 mg by mouth at bedtime.          History   Social History  . Marital Status: Married    Spouse Name: N/A    Number of Children: N/A  . Years of Education: N/A   Occupational History  . Not on file.   Social History Main Topics  . Smoking status: Never Smoker   . Smokeless tobacco: Not on file  . Alcohol Use: 0.6 oz/week    1 Glasses of wine per week  . Drug Use: No  . Sexually Active: Not on file   Other Topics Concern  . Not on file   Social History Narrative  . No narrative on file    Family History  Problem Relation Age of Onset  . Hypertension    . Diabetes Mother   . Diabetes Sister   . Diabetes Brother     Past Medical History  Diagnosis Date  . Hyperlipidemia   . CAD (coronary artery disease)     January, 2012, DES proximal dominant LAD at the diagonal takeoff ( P2Y12-no  inhibition by Plavix) Effient used    No past surgical history on file.  ROS  Patient denies fever, chills, headache, sweats, rash, change in vision, change in hearing, chest pain, cough, nausea vomiting, urinary symptoms.  All other systems are reviewed and are negative.  PHYSICAL EXAM Patient is stable today.  He has gained weight.  He is oriented to person time and place.  Affect is normal.  Head is atraumatic.  There is no xanthelasma.  No jugulovenous distention.  Lungs are clear.  Respiratory effort is nonlabored.  Cardiac exam reveals S1 and S2.  No clicks or significant murmurs.  Abdomen is soft.  There's no peripheral edema.  There are no musculoskeletal deformities.  No skin rashes. Filed Vitals:   01/07/11 1005  BP: 130/82  Pulse: 58  Height: 5\' 10"  (1.778 m)  Weight: 224 lb 1.9 oz (101.66 kg)    EKG is done today and reviewed by me.  It is carefully compared to his prior tracing.  The patient  has inverted T waves in leads V4 to V6 and leads 2 and 3.  These are unchanged from prior tracings.  ASSESSMENT & PLAN

## 2011-01-07 NOTE — Assessment & Plan Note (Signed)
Patient have a fasting lipid.  I want to be sure that we are treating him to the aggressive guidelines.

## 2011-01-07 NOTE — Patient Instructions (Addendum)
Your physician wants you to follow-up in: 4 MONTHS EARLY FEB WITH DR Chancy Hurter will receive a reminder letter in the mail two months in advance. If you don't receive a letter, please call our office to schedule the follow-up appointment. Your physician has recommended you make the following change in your medication: DECREASE ASPIRIN TO 81 MG Your physician recommends that you return for lab work in: FASTING  LIPID AND CBC DX 272.4 V58.69 Your physician encouraged you to lose weight for better health.

## 2011-01-07 NOTE — Assessment & Plan Note (Signed)
The patient has gained weight since the last visit.  It had a careful discussion with him about the importance of losing weight.  This will help with his blood pressure control and his lipid control.

## 2011-01-14 ENCOUNTER — Other Ambulatory Visit (INDEPENDENT_AMBULATORY_CARE_PROVIDER_SITE_OTHER): Payer: BC Managed Care – PPO | Admitting: *Deleted

## 2011-01-14 DIAGNOSIS — E785 Hyperlipidemia, unspecified: Secondary | ICD-10-CM

## 2011-01-14 DIAGNOSIS — R238 Other skin changes: Secondary | ICD-10-CM

## 2011-01-14 LAB — LIPID PANEL
HDL: 46.8 mg/dL (ref >39.00)
Total CHOL/HDL Ratio: 3
VLDL: 25.8 mg/dL (ref 0.0–40.0)

## 2011-01-14 LAB — CBC WITH DIFFERENTIAL/PLATELET
Basophils Relative: 0.3 % (ref 0.0–3.0)
HCT: 42.9 % (ref 39.0–52.0)
Hemoglobin: 14.6 g/dL (ref 13.0–17.0)
Lymphocytes Relative: 26.6 % (ref 12.0–46.0)
MCHC: 33.9 g/dL (ref 30.0–36.0)
Monocytes Relative: 10.1 % (ref 3.0–12.0)
Neutro Abs: 3.8 10*3/uL (ref 1.4–7.7)
RBC: 4.67 Mil/uL (ref 4.22–5.81)

## 2011-01-15 NOTE — Progress Notes (Signed)
N/A.  LMTC. 

## 2011-01-16 ENCOUNTER — Telehealth: Payer: Self-pay | Admitting: Cardiology

## 2011-01-16 NOTE — Telephone Encounter (Signed)
Pt returning your call

## 2011-01-16 NOTE — Telephone Encounter (Signed)
Pt was notified of lab results. 

## 2011-01-16 NOTE — Progress Notes (Signed)
Pt was notified of results

## 2011-05-20 ENCOUNTER — Encounter: Payer: Self-pay | Admitting: Cardiology

## 2011-05-20 ENCOUNTER — Ambulatory Visit (INDEPENDENT_AMBULATORY_CARE_PROVIDER_SITE_OTHER): Payer: BC Managed Care – PPO | Admitting: Cardiology

## 2011-05-20 DIAGNOSIS — I2510 Atherosclerotic heart disease of native coronary artery without angina pectoris: Secondary | ICD-10-CM

## 2011-05-20 DIAGNOSIS — R233 Spontaneous ecchymoses: Secondary | ICD-10-CM

## 2011-05-20 DIAGNOSIS — R238 Other skin changes: Secondary | ICD-10-CM

## 2011-05-20 DIAGNOSIS — R635 Abnormal weight gain: Secondary | ICD-10-CM

## 2011-05-20 NOTE — Assessment & Plan Note (Signed)
Coronary disease is stable. It is now time for him to stop Prasugrel. Aspirin will be continued.

## 2011-05-20 NOTE — Progress Notes (Signed)
HPI  Patient is seen today to followup coronary disease. He's doing extremely well. I met him in January, 16109 he had classic symptoms on the treadmill. He had a tight LAD lesion that was stented. He's been on aspirin and Prasugrel since then. His Prasugrel can be stopped today. He has no symptoms and he is active on his treadmill.  No Known Allergies  Current Outpatient Prescriptions  Medication Sig Dispense Refill  . Ascorbic Acid (VITAMIN C) 500 MG tablet Take 500 mg by mouth daily.        Marland Kitchen aspirin 81 MG tablet Take 1 tablet (81 mg total) by mouth daily.      . Multiple Vitamin (MULTIVITAMIN) capsule Take 1 capsule by mouth daily.        . nitroGLYCERIN (NITROSTAT) 0.4 MG SL tablet Place 0.4 mg under the tongue every 5 (five) minutes as needed.        . prasugrel (EFFIENT) 10 MG TABS Take 10 mg by mouth daily.        . sildenafil (VIAGRA) 100 MG tablet Take 100 mg by mouth daily as needed.        . simvastatin (ZOCOR) 40 MG tablet Take 40 mg by mouth at bedtime.          History   Social History  . Marital Status: Married    Spouse Name: N/A    Number of Children: N/A  . Years of Education: N/A   Occupational History  . Not on file.   Social History Main Topics  . Smoking status: Never Smoker   . Smokeless tobacco: Not on file  . Alcohol Use: 0.6 oz/week    1 Glasses of wine per week  . Drug Use: No  . Sexually Active: Not on file   Other Topics Concern  . Not on file   Social History Narrative  . No narrative on file    Family History  Problem Relation Age of Onset  . Hypertension    . Diabetes Mother   . Diabetes Sister   . Diabetes Brother     Past Medical History  Diagnosis Date  . Hyperlipidemia   . CAD (coronary artery disease)     January, 2012, DES proximal dominant LAD at the diagonal takeoff ( P2Y12-no inhibition by Plavix) Effient used  . Weight gain     October, 2012  . Easy bruisability     October, 2012    No past surgical history on  file.  ROS  Patient denies fever, chills, headache, sweats, rash, change in vision, change in hearing, chest pain, cough, nausea vomiting, urinary symptoms. All other systems are reviewed and are negative.  PHYSICAL EXAM Patient is oriented to person time and place. Affect is normal. Head is atraumatic. There is no xanthelasma. There is no jugulovenous distention. Lungs are clear. Respiratory effort is nonlabored. Cardiac exam reveals S1 and S2. There no clicks or significant murmurs. The abdomen is soft. There is no peripheral edema. Filed Vitals:   05/20/11 1553  BP: 136/82  Pulse: 76  Resp: 18  Height: 5\' 11"  (1.803 m)  Weight: 226 lb (102.513 kg)     ASSESSMENT & PLAN

## 2011-05-20 NOTE — Assessment & Plan Note (Signed)
Patient's weight is essentially the same as 6 months ago. He says he is now ready to get serious and he will lose some weight before the next visit.

## 2011-05-20 NOTE — Assessment & Plan Note (Signed)
The patient was able to continue his Prasugrel for one year. It is now to be stopped. He should not have any significant ongoing problems with easy bruisability

## 2011-05-20 NOTE — Patient Instructions (Signed)
Your physician wants you to follow-up in:  6 months. You will receive a reminder letter in the mail two months in advance. If you don't receive a letter, please call our office to schedule the follow-up appointment.   

## 2011-06-09 ENCOUNTER — Other Ambulatory Visit: Payer: Self-pay | Admitting: Internal Medicine

## 2011-06-09 ENCOUNTER — Other Ambulatory Visit: Payer: Self-pay

## 2011-06-09 MED ORDER — SIMVASTATIN 40 MG PO TABS: 40.0000 mg | ORAL_TABLET | Freq: Every day | ORAL | Status: DC

## 2011-06-11 ENCOUNTER — Other Ambulatory Visit: Payer: Self-pay

## 2011-06-11 MED ORDER — SIMVASTATIN 40 MG PO TABS: 40.0000 mg | ORAL_TABLET | Freq: Every day | ORAL | Status: DC

## 2011-06-11 NOTE — Telephone Encounter (Signed)
Pt only has a few pills left

## 2011-11-06 ENCOUNTER — Telehealth: Payer: Self-pay | Admitting: Cardiology

## 2011-11-06 NOTE — Telephone Encounter (Signed)
Please return call to [facility]- Dr. Holly Bodily DENTIST  [phone]   Patient to have dental implant 8/21, please call French Ana to verify if patient new antibiotic pre-med.

## 2011-11-07 NOTE — Telephone Encounter (Signed)
No need for antibiotics per Dr Myrtis Ser.

## 2011-11-07 NOTE — Telephone Encounter (Signed)
N/A.  Office is closed today.

## 2011-11-10 NOTE — Telephone Encounter (Signed)
F/U   French Ana calling back . Relay information from below message .

## 2011-11-10 NOTE — Telephone Encounter (Signed)
Eric Rosales was notified.

## 2011-12-22 ENCOUNTER — Encounter: Payer: Self-pay | Admitting: Cardiology

## 2011-12-22 ENCOUNTER — Ambulatory Visit (INDEPENDENT_AMBULATORY_CARE_PROVIDER_SITE_OTHER): Payer: BC Managed Care – PPO | Admitting: Cardiology

## 2011-12-22 VITALS — BP 143/83 | HR 80 | Ht 71.0 in | Wt 222.0 lb

## 2011-12-22 DIAGNOSIS — I2510 Atherosclerotic heart disease of native coronary artery without angina pectoris: Secondary | ICD-10-CM

## 2011-12-22 DIAGNOSIS — R635 Abnormal weight gain: Secondary | ICD-10-CM

## 2011-12-22 DIAGNOSIS — E785 Hyperlipidemia, unspecified: Secondary | ICD-10-CM

## 2011-12-22 NOTE — Assessment & Plan Note (Signed)
Coronary disease is stable. He is done fine off dual antiplatelet therapy.

## 2011-12-22 NOTE — Patient Instructions (Addendum)
Your physician recommends that you continue on your current medications as directed. Please refer to the Current Medication list given to you today.  Your physician wants you to follow-up in: 1 year. You will receive a reminder letter in the mail two months in advance. If you don't receive a letter, please call our office to schedule the follow-up appointment.  

## 2011-12-22 NOTE — Assessment & Plan Note (Signed)
He is slowly losing a little weight.

## 2011-12-22 NOTE — Assessment & Plan Note (Signed)
It is time for the patient's lipids to be checked. This will be done at the time of his complete history and physical with his primary physician.

## 2011-12-22 NOTE — Progress Notes (Signed)
   HPI Patient is seen for cardiology followup. I saw him last February, 2013. At that time his Prasugrel was stopped. He remains on aspirin. He's doing very well. He had some slight chest discomfort recently that sounds musculoskeletal. It went away over several days. It was not reproduced on the treadmill. He has lost 4 pounds since his visit  No Known Allergies  Current Outpatient Prescriptions  Medication Sig Dispense Refill  . Ascorbic Acid (VITAMIN C) 500 MG tablet Take 500 mg by mouth daily.        Marland Kitchen aspirin 81 MG tablet Take 1 tablet (81 mg total) by mouth daily.      . Multiple Vitamin (MULTIVITAMIN) capsule Take 1 capsule by mouth daily.        . nitroGLYCERIN (NITROSTAT) 0.4 MG SL tablet Place 0.4 mg under the tongue every 5 (five) minutes as needed.        . simvastatin (ZOCOR) 40 MG tablet Take 1 tablet (40 mg total) by mouth at bedtime.  30 tablet  5    History   Social History  . Marital Status: Married    Spouse Name: N/A    Number of Children: N/A  . Years of Education: N/A   Occupational History  . Not on file.   Social History Main Topics  . Smoking status: Never Smoker   . Smokeless tobacco: Not on file  . Alcohol Use: 0.6 oz/week    1 Glasses of wine per week  . Drug Use: No  . Sexually Active: Not on file   Other Topics Concern  . Not on file   Social History Narrative  . No narrative on file    Family History  Problem Relation Age of Onset  . Hypertension    . Diabetes Mother   . Diabetes Sister   . Diabetes Brother     Past Medical History  Diagnosis Date  . Hyperlipidemia   . CAD (coronary artery disease)     January, 2012, DES proximal dominant LAD at the diagonal takeoff ( P2Y12-no inhibition by Plavix) Effient used  . Weight gain     October, 2012  . Easy bruisability     October, 2012    History reviewed. No pertinent past surgical history.  ROS   Patient denies fever, chills, headache, sweats, rash, change in vision, change  in hearing, chest pain, cough, nausea vomiting, urinary symptoms. All other systems are reviewed and are negative.  PHYSICAL EXAM  Patient is oriented to person time and place. Affect is normal. There is no jugulovenous distention. Lungs are clear. Respiratory effort is nonlabored. Cardiac exam reveals S1 and S2. There no clicks or significant murmurs. The abdomen is soft. Is no peripheral edema.  Filed Vitals:   12/22/11 1530  BP: 143/83  Pulse: 80  Height: 5\' 11"  (1.803 m)  Weight: 222 lb (100.699 kg)  SpO2: 99%   EKG is done today and reviewed by me. He has normal sinus rhythm. He has abnormal T wave changes that are absolutely unchanged from prior tracings.  ASSESSMENT & PLAN

## 2011-12-23 ENCOUNTER — Other Ambulatory Visit: Payer: Self-pay | Admitting: Internal Medicine

## 2012-03-19 ENCOUNTER — Encounter: Payer: Self-pay | Admitting: Family Medicine

## 2012-03-19 ENCOUNTER — Ambulatory Visit (INDEPENDENT_AMBULATORY_CARE_PROVIDER_SITE_OTHER): Payer: BC Managed Care – PPO | Admitting: Family Medicine

## 2012-03-19 VITALS — BP 120/60 | HR 65 | Temp 98.1°F | Ht 70.0 in | Wt 227.0 lb

## 2012-03-19 DIAGNOSIS — E785 Hyperlipidemia, unspecified: Secondary | ICD-10-CM

## 2012-03-19 DIAGNOSIS — Z0000 Encounter for general adult medical examination without abnormal findings: Secondary | ICD-10-CM

## 2012-03-19 LAB — CBC WITH DIFFERENTIAL/PLATELET
Basophils Relative: 0.4 % (ref 0.0–3.0)
Eosinophils Relative: 1.5 % (ref 0.0–5.0)
HCT: 41.9 % (ref 39.0–52.0)
Hemoglobin: 13.9 g/dL (ref 13.0–17.0)
Lymphs Abs: 1.6 10*3/uL (ref 0.7–4.0)
MCV: 91 fl (ref 78.0–100.0)
Monocytes Absolute: 0.7 10*3/uL (ref 0.1–1.0)
Neutro Abs: 3.8 10*3/uL (ref 1.4–7.7)
Platelets: 144 10*3/uL — ABNORMAL LOW (ref 150.0–400.0)
RBC: 4.6 Mil/uL (ref 4.22–5.81)
WBC: 6.2 10*3/uL (ref 4.5–10.5)

## 2012-03-19 LAB — BASIC METABOLIC PANEL
BUN: 12 mg/dL (ref 6–23)
CO2: 30 mEq/L (ref 19–32)
Calcium: 9.5 mg/dL (ref 8.4–10.5)
Creatinine, Ser: 0.7 mg/dL (ref 0.4–1.5)
GFR: 142.8 mL/min (ref >60.00)
Glucose, Bld: 95 mg/dL (ref 70–99)

## 2012-03-19 LAB — HEPATIC FUNCTION PANEL
Albumin: 3.9 g/dL (ref 3.5–5.2)
Total Protein: 6.5 g/dL (ref 6.0–8.3)

## 2012-03-19 LAB — LIPID PANEL
HDL: 64.4 mg/dL (ref >39.00)
Triglycerides: 81 mg/dL (ref 0.0–149.0)

## 2012-03-19 LAB — TSH: TSH: 3.22 u[IU]/mL (ref 0.35–5.50)

## 2012-03-19 NOTE — Assessment & Plan Note (Signed)
Pt's PE WNL.  UTD on colonoscopy.  Check labs.  Anticipatory guidance provided.  

## 2012-03-19 NOTE — Progress Notes (Signed)
  Subjective:    Patient ID: Eric Rosales, male    DOB: [birth year], 56 y.o.   MRN: [id number]  HPI CPE- UTD on colonoscopy.   Review of Systems Patient reports no vision/hearing changes, anorexia, fever ,adenopathy, persistant/recurrent hoarseness, swallowing issues, chest pain, palpitations, edema, persistant/recurrent cough, hemoptysis, dyspnea (rest,exertional, paroxysmal nocturnal), gastrointestinal  bleeding (melena, rectal bleeding), abdominal pain, excessive heart burn, GU symptoms (dysuria, hematuria, voiding/incontinence issues) syncope, focal weakness, memory loss, numbness & tingling, skin/hair/nail changes, depression, anxiety, abnormal bruising/bleeding, musculoskeletal symptoms/signs.     Objective:   Physical Exam BP 120/60  Pulse 65  Temp 98.1 F (36.7 C) (Oral)  Ht 5\' 10"  (1.778 m)  Wt 227 lb (102.967 kg)  BMI 32.57 kg/m2  SpO2 98%  General Appearance:    Alert, cooperative, no distress, appears stated age  Head:    Normocephalic, without obvious abnormality, atraumatic  Eyes:    PERRL, conjunctiva/corneas clear, EOM's intact, fundi    benign, both eyes       Ears:    Normal TM's and external ear canals, both ears  Nose:   Nares normal, septum midline, mucosa normal, no drainage   or sinus tenderness  Throat:   Lips, mucosa, and tongue normal; teeth and gums normal  Neck:   Supple, symmetrical, trachea midline, no adenopathy;       thyroid:  No enlargement/tenderness/nodules  Back:     Symmetric, no curvature, ROM normal, no CVA tenderness  Lungs:     Clear to auscultation bilaterally, respirations unlabored  Chest wall:    No tenderness or deformity  Heart:    Regular rate and rhythm, S1 and S2 normal, no murmur, rub   or gallop  Abdomen:     Soft, non-tender, bowel sounds active all four quadrants,    no masses, no organomegaly  Genitalia:    Normal male without lesion, discharge or tenderness  Rectal:    Normal tone, normal prostate, no masses or tenderness   Extremities:   Extremities normal, atraumatic, no cyanosis or edema  Pulses:   2+ and symmetric all extremities  Skin:   Skin color, texture, turgor normal, no rashes or lesions  Lymph nodes:   Cervical, supraclavicular, and axillary nodes normal  Neurologic:   CNII-XII intact. Normal strength, sensation and reflexes      throughout          Assessment & Plan:

## 2012-03-19 NOTE — Patient Instructions (Addendum)
Follow up in 6 months to check BP and cholesterol Keep up the good work!  You look great!!! We'll notify you of your lab results and make any changes if needed Call with any questions or concerns Happy Holidays!!!

## 2012-03-19 NOTE — Assessment & Plan Note (Signed)
Chronic problem, tolerating statin.  Check labs.  Adjust meds prn  

## 2012-03-22 ENCOUNTER — Encounter: Payer: Self-pay | Admitting: Family Medicine

## 2012-03-30 ENCOUNTER — Other Ambulatory Visit: Payer: Self-pay | Admitting: *Deleted

## 2012-03-30 NOTE — Telephone Encounter (Signed)
Last OV 03-19-12, no history of refill.

## 2012-03-30 NOTE — Telephone Encounter (Signed)
Which med?

## 2012-03-31 NOTE — Telephone Encounter (Signed)
Ok for Viagra 100mg , #15, 3 refills

## 2012-04-02 MED ORDER — SILDENAFIL CITRATE 100 MG PO TABS: 100.0000 mg | ORAL_TABLET | Freq: Every day | ORAL | Status: DC | PRN

## 2012-04-02 NOTE — Telephone Encounter (Signed)
Rx sent to the pharmacy by e-script.//AB/CMA 

## 2012-05-15 ENCOUNTER — Other Ambulatory Visit: Payer: Self-pay

## 2012-05-27 ENCOUNTER — Encounter: Payer: Self-pay | Admitting: Cardiology

## 2012-05-27 ENCOUNTER — Other Ambulatory Visit: Payer: Self-pay

## 2012-05-27 ENCOUNTER — Other Ambulatory Visit: Payer: Self-pay | Admitting: Internal Medicine

## 2012-05-27 MED ORDER — SIMVASTATIN 40 MG PO TABS: 40.0000 mg | ORAL_TABLET | Freq: Every day | ORAL | Status: DC

## 2012-06-20 IMAGING — CR DG CHEST 2V
2 series · 2 of 2 positions shown · non-contrast
Comparison: None

CLINICAL DATA: Chest discomfort.

CHEST - 2 VIEW

[view not recorded (1 of 2)]
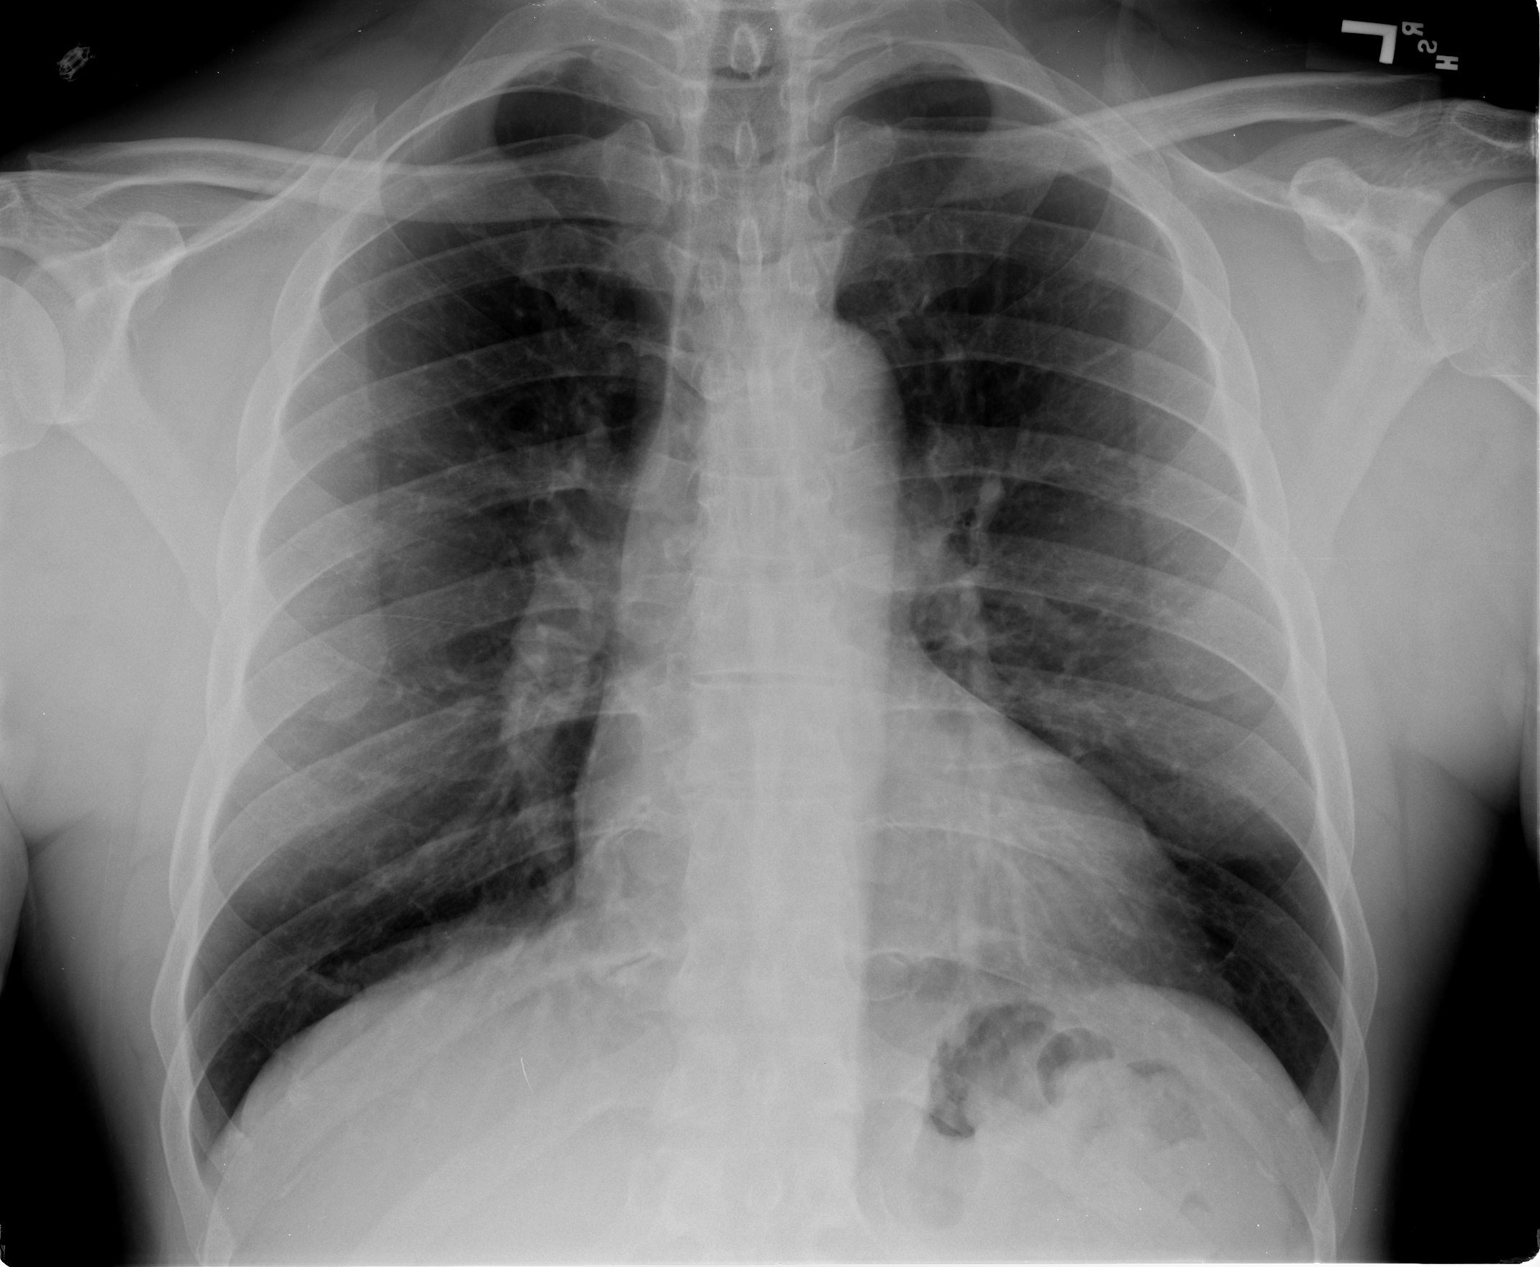

[view not recorded (2 of 2)]
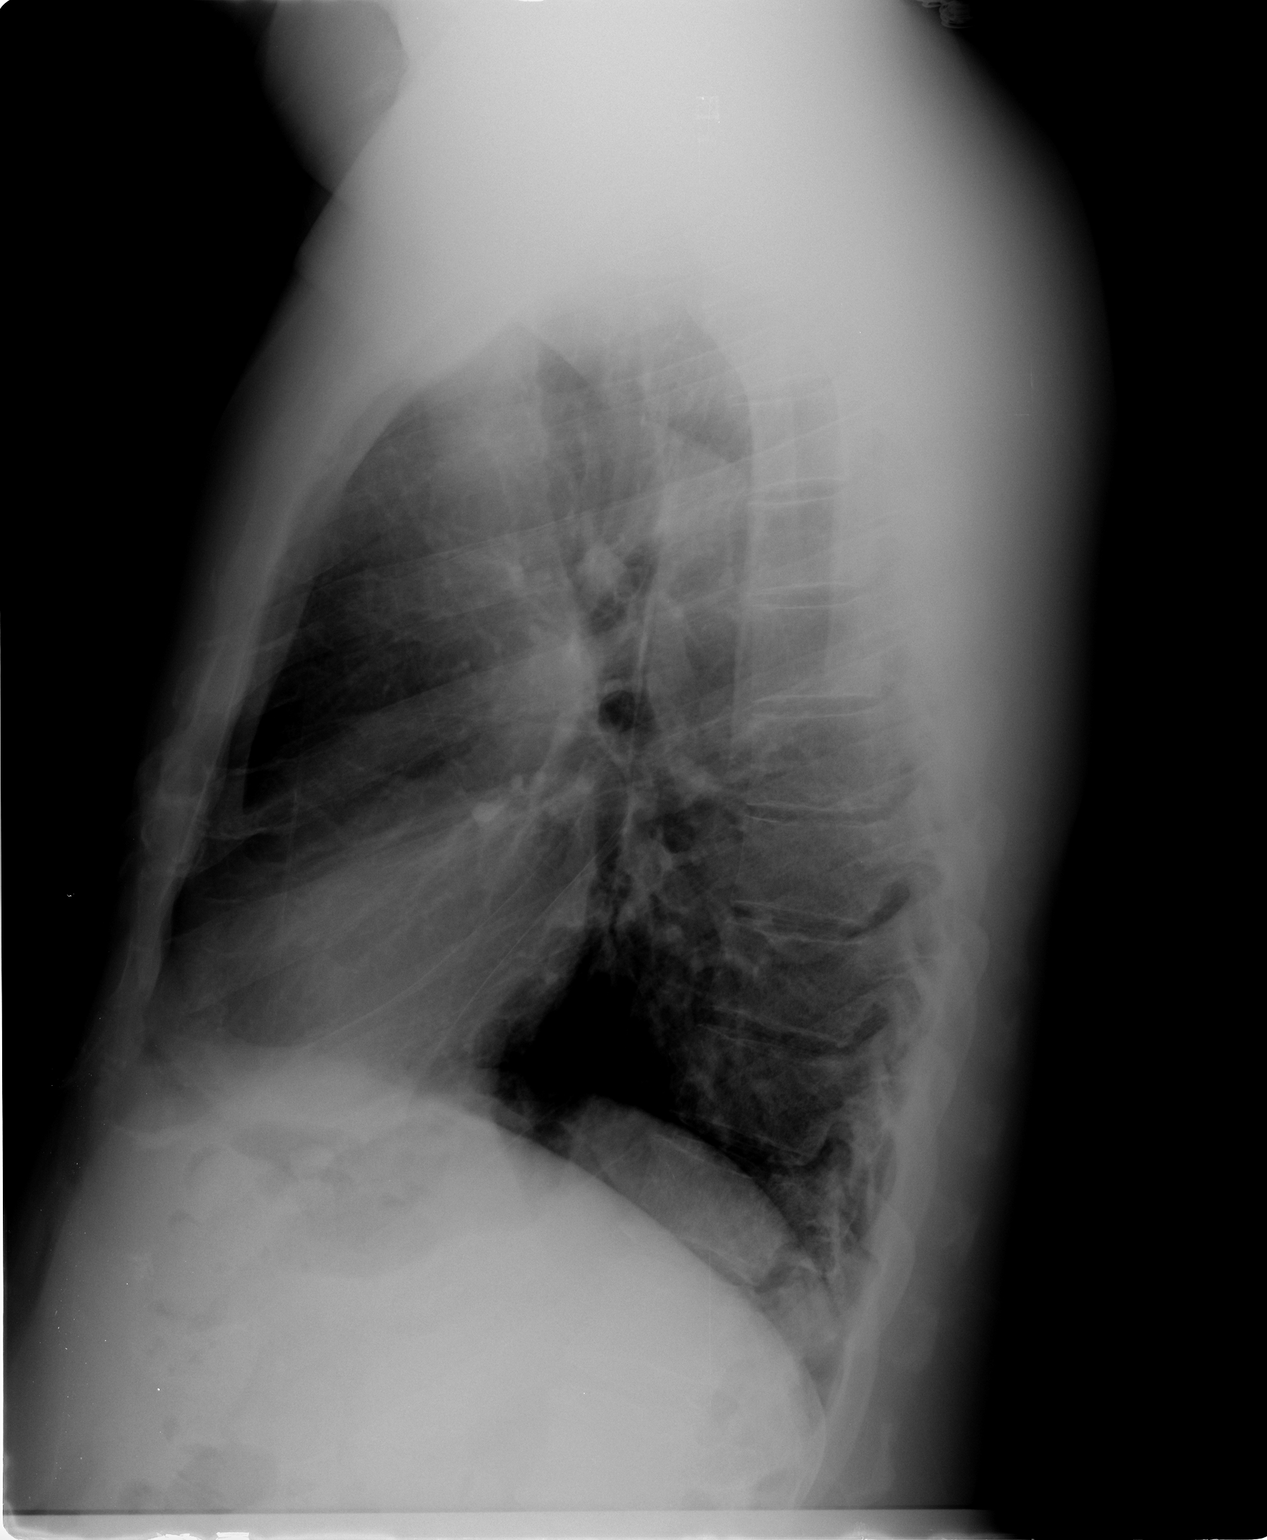

[2 of 2 positions shown; findings below may reference images not displayed]

FINDINGS: Heart is upper limits normal in size.  Lungs are clear.
No effusions.  No acute bony abnormality.
IMPRESSION: No active disease.

## 2012-12-07 ENCOUNTER — Encounter: Payer: Self-pay | Admitting: Cardiology

## 2012-12-19 ENCOUNTER — Encounter: Payer: Self-pay | Admitting: Cardiology

## 2012-12-20 ENCOUNTER — Encounter: Payer: Self-pay | Admitting: Cardiology

## 2012-12-20 ENCOUNTER — Ambulatory Visit (INDEPENDENT_AMBULATORY_CARE_PROVIDER_SITE_OTHER): Payer: BC Managed Care – PPO | Admitting: Cardiology

## 2012-12-20 VITALS — BP 140/90 | HR 61 | Wt 228.0 lb

## 2012-12-20 DIAGNOSIS — E785 Hyperlipidemia, unspecified: Secondary | ICD-10-CM

## 2012-12-20 DIAGNOSIS — I2510 Atherosclerotic heart disease of native coronary artery without angina pectoris: Secondary | ICD-10-CM

## 2012-12-20 NOTE — Assessment & Plan Note (Signed)
I explained to the patient that the new guidelines recommend a dose of at least 40 mg of Lipitor. He has just received 90 days of his 40 mg simvastatin. We will check labs. If his LDL is reasonable, I will have him finish his simvastatin before changing to Lipitor. If his LDL is not controlled, we will move to the Lipitor  sooner.

## 2012-12-20 NOTE — Progress Notes (Signed)
HPI  Patient is seen in followup coronary disease. I saw him one year ago. He has had a coronary intervention. He's doing very well. He's not having any significant symptoms.  No Known Allergies  Current Outpatient Prescriptions  Medication Sig Dispense Refill  . Ascorbic Acid (VITAMIN C) 500 MG tablet Take 500 mg by mouth daily.        Marland Kitchen aspirin 81 MG tablet Take 1 tablet (81 mg total) by mouth daily.      . Multiple Vitamin (MULTIVITAMIN) capsule Take 1 capsule by mouth daily.        . nitroGLYCERIN (NITROSTAT) 0.4 MG SL tablet Place 0.4 mg under the tongue every 5 (five) minutes as needed.        . sildenafil (VIAGRA) 100 MG tablet Take 1 tablet (100 mg total) by mouth daily as needed.  15 tablet  3  . simvastatin (ZOCOR) 40 MG tablet Take 1 tablet (40 mg total) by mouth at bedtime.  90 tablet  2   No current facility-administered medications for this visit.    History   Social History  . Marital Status: Married    Spouse Name: N/A    Number of Children: N/A  . Years of Education: N/A   Occupational History  . Not on file.   Social History Main Topics  . Smoking status: Never Smoker   . Smokeless tobacco: Not on file  . Alcohol Use: 0.6 oz/week    1 Glasses of wine per week  . Drug Use: No  . Sexual Activity: Not on file   Other Topics Concern  . Not on file   Social History Narrative  . No narrative on file    Family History  Problem Relation Age of Onset  . Hypertension    . Diabetes Mother   . Diabetes Sister   . Diabetes Brother     Past Medical History  Diagnosis Date  . Hyperlipidemia   . CAD (coronary artery disease)     January, 2012, DES proximal dominant LAD at the diagonal takeoff ( P2Y12-no inhibition by Plavix) Effient used  . Weight gain     October, 2012  . Easy bruisability     October, 2012    History reviewed. No pertinent past surgical history.  Patient Active Problem List   Diagnosis Date Noted  . Weight gain     Priority:  High  . Easy bruisability     Priority: High  . CAD (coronary artery disease)     Priority: High  . HYPERLIPIDEMIA 06/15/2006    Priority: High  . Routine general medical examination at a health care facility 03/19/2012  . NEOPLASM, SKIN, UNCERTAIN BEHAVIOR 01/17/2010  . CORNS AND CALLUSES 01/17/2010  . NONSPEC ELEVATION OF LEVELS OF TRANSAMINASE/LDH 11/02/2008  . ERECTILE DYSFUNCTION 01/12/2007  . RECTAL BLEEDING 01/12/2007    ROS   Patient denies fever, chills, headache, sweats, rash, change in vision, change in hearing, chest pain, cough, nausea vomiting, urinary symptoms. All other systems are reviewed and are negative.  PHYSICAL EXAM  Patient is oriented to person time and place. Affect is normal. There is no jugulovenous distention. Lungs are clear. Respiratory effort is nonlabored. Cardiac exam reveals S1 and S2. There no clicks or significant murmurs. The abdomen is soft. Is no peripheral edema.  Filed Vitals:   12/20/12 1618  BP: 140/90  Pulse: 61  Weight: 228 lb (103.42 kg)   EKG is done today and reviewed by me. There  are old nonspecific ST-T wave changes. These are unchanged.  ASSESSMENT & PLAN

## 2012-12-20 NOTE — Assessment & Plan Note (Signed)
Coronary disease is stable. No change in therapy. 

## 2012-12-20 NOTE — Patient Instructions (Addendum)
**Note De-Identified Lajeana Strough Obfuscation** Your physician recommends that you continue on your current medications as directed. Please refer to the Current Medication list given to you today.  Your physician recommends that you return for lab work in: Wednesday (12/22/12) Please do not eat or drink after midnight the night before labs are drawn. You may drink enough water to take your morning medications that morning.  Your physician wants you to follow-up in: 1 year. You will receive a reminder letter in the mail two months in advance. If you don't receive a letter, please call our office to schedule the follow-up appointment.

## 2012-12-21 ENCOUNTER — Ambulatory Visit: Payer: BC Managed Care – PPO | Admitting: Cardiology

## 2012-12-22 ENCOUNTER — Other Ambulatory Visit (INDEPENDENT_AMBULATORY_CARE_PROVIDER_SITE_OTHER): Payer: BC Managed Care – PPO

## 2012-12-22 DIAGNOSIS — E785 Hyperlipidemia, unspecified: Secondary | ICD-10-CM

## 2012-12-22 LAB — LIPID PANEL
Cholesterol: 162 mg/dL (ref 0–200)
LDL Cholesterol: 100 mg/dL — ABNORMAL HIGH (ref 0–99)
Triglycerides: 114 mg/dL (ref 0.0–149.0)

## 2012-12-27 ENCOUNTER — Other Ambulatory Visit: Payer: Self-pay

## 2012-12-27 ENCOUNTER — Telehealth: Payer: Self-pay | Admitting: Cardiology

## 2012-12-27 MED ORDER — ATORVASTATIN CALCIUM 40 MG PO TABS: 40.0000 mg | ORAL_TABLET | Freq: Every day | ORAL | Status: DC

## 2012-12-27 NOTE — Telephone Encounter (Signed)
Pt rtn call to lynn °

## 2012-12-27 NOTE — Telephone Encounter (Signed)
**Note De-identified Eric Rosales Obfuscation** Pt given lab results, he verbalized understanding. 

## 2013-01-13 ENCOUNTER — Encounter: Payer: Self-pay | Admitting: Cardiology

## 2013-01-13 MED ORDER — SIMVASTATIN 40 MG PO TABS: 40.0000 mg | ORAL_TABLET | Freq: Every day | ORAL | Status: DC

## 2013-01-13 NOTE — Telephone Encounter (Signed)
Mr. Lovings, this is ok and thank you for notifying us of your decision. I have taken Atorvastatin out of your medications list and replaced with Simvastatin 40 mg which is what you were taking before change. Also I notified [facility] of this change. If you have any questions or concerns please contact me. Thanks again, Larita Fife (Dr Myrtis Ser nurse).

## 2013-02-03 ENCOUNTER — Other Ambulatory Visit: Payer: Self-pay

## 2013-02-24 ENCOUNTER — Other Ambulatory Visit: Payer: Self-pay | Admitting: Cardiology

## 2013-03-18 ENCOUNTER — Telehealth: Payer: Self-pay

## 2013-03-18 NOTE — Telephone Encounter (Addendum)
Left message for call back  identifiable  Medication List and allergies:  Reviewed and updated  90 day supply/mail order: Changes to [facility] (appears to be by [employer] but not listed in our pharmacies) Local prescriptions: [facility]  Immunizations due: declines flu vaccine  A/P:   No changes to FH or PSH Tdap--states that he received at [facility]/have reviewed records and unable to locate PSA--02/2012 0.00 [facility]--10/2007--Dr Gessner--adenomatous polyp-- due 2014  To Discuss with Provider: Not at this time

## 2013-03-21 ENCOUNTER — Encounter: Payer: Self-pay | Admitting: Family Medicine

## 2013-03-21 ENCOUNTER — Ambulatory Visit (INDEPENDENT_AMBULATORY_CARE_PROVIDER_SITE_OTHER): Payer: BC Managed Care – PPO | Admitting: Family Medicine

## 2013-03-21 VITALS — BP 128/82 | HR 74 | Temp 98.1°F | Resp 17 | Ht 71.0 in | Wt 232.5 lb

## 2013-03-21 DIAGNOSIS — Z0000 Encounter for general adult medical examination without abnormal findings: Secondary | ICD-10-CM

## 2013-03-21 LAB — CBC WITH DIFFERENTIAL/PLATELET
Basophils Relative: 0.4 % (ref 0.0–3.0)
Eosinophils Absolute: 0.1 10*3/uL (ref 0.0–0.7)
HCT: 41.7 % (ref 39.0–52.0)
Lymphs Abs: 1.7 10*3/uL (ref 0.7–4.0)
MCHC: 33.8 g/dL (ref 30.0–36.0)
MCV: 89 fl (ref 78.0–100.0)
Monocytes Absolute: 0.6 10*3/uL (ref 0.1–1.0)
Neutro Abs: 3.3 10*3/uL (ref 1.4–7.7)
Neutrophils Relative %: 57.6 % (ref 43.0–77.0)
RBC: 4.68 Mil/uL (ref 4.22–5.81)

## 2013-03-21 LAB — BASIC METABOLIC PANEL
BUN: 13 mg/dL (ref 6–23)
Calcium: 9.1 mg/dL (ref 8.4–10.5)
Creatinine, Ser: 1.2 mg/dL (ref 0.4–1.5)
GFR: 84.22 mL/min (ref >60.00)
Potassium: 3.9 mEq/L (ref 3.5–5.1)

## 2013-03-21 LAB — TSH: TSH: 2.68 u[IU]/mL (ref 0.35–5.50)

## 2013-03-21 LAB — HEPATIC FUNCTION PANEL
ALT: 34 U/L (ref 0–53)
AST: 28 U/L (ref 0–37)
Albumin: 4.3 g/dL (ref 3.5–5.2)

## 2013-03-21 LAB — LIPID PANEL: VLDL: 18.4 mg/dL (ref 0.0–40.0)

## 2013-03-21 LAB — PSA: PSA: 0.58 ng/mL (ref 0.10–4.00)

## 2013-03-21 NOTE — Patient Instructions (Signed)
Follow up in 6 months to recheck cholesterol We'll notify you of your lab results and make any changes if needed Keep up the good work on healthy diet and regular exercise Call with any questions or concerns Happy Holidays! 

## 2013-03-21 NOTE — Assessment & Plan Note (Signed)
Pt's PE WNL.  UTD on colonoscopy.  Check labs.  Anticipatory guidance provided.  

## 2013-03-21 NOTE — Progress Notes (Signed)
   Subjective:    Patient ID: Eric Rosales, male    DOB: 10-04-1955, 57 y.o.   MRN: [id number]  HPI CPE- UTD on colonoscopy, no concerns today.  Not interested in flu shot.   Review of Systems Patient reports no vision/hearing changes, anorexia, fever ,adenopathy, persistant/recurrent hoarseness, swallowing issues, chest pain, palpitations, edema, persistant/recurrent cough, hemoptysis, dyspnea (rest,exertional, paroxysmal nocturnal), gastrointestinal  bleeding (melena, rectal bleeding), abdominal pain, excessive heart burn, GU symptoms (dysuria, hematuria, voiding/incontinence issues) syncope, focal weakness, memory loss, numbness & tingling, skin/hair/nail changes, depression, anxiety, abnormal bruising/bleeding, musculoskeletal symptoms/signs.     Objective:   Physical Exam BP 128/82  Pulse 74  Temp(Src) 98.1 F (36.7 C) (Oral)  Resp 17  Ht 5\' 11"  (1.803 m)  Wt 232 lb 8 oz (105.461 kg)  BMI 32.44 kg/m2  SpO2 97%  General Appearance:    Alert, cooperative, no distress, appears stated age  Head:    Normocephalic, without obvious abnormality, atraumatic  Eyes:    PERRL, conjunctiva/corneas clear, EOM's intact, fundi    benign, both eyes       Ears:    Normal TM's and external ear canals, both ears  Nose:   Nares normal, septum midline, mucosa normal, no drainage   or sinus tenderness  Throat:   Lips, mucosa, and tongue normal; teeth and gums normal  Neck:   Supple, symmetrical, trachea midline, no adenopathy;       thyroid:  No enlargement/tenderness/nodules  Back:     Symmetric, no curvature, ROM normal, no CVA tenderness  Lungs:     Clear to auscultation bilaterally, respirations unlabored  Chest wall:    No tenderness or deformity  Heart:    Regular rate and rhythm, S1 and S2 normal, no murmur, rub   or gallop  Abdomen:     Soft, non-tender, bowel sounds active all four quadrants,    no masses, no organomegaly  Genitalia:    Normal male without lesion, discharge or tenderness    Rectal:    Normal tone, normal prostate, no masses or tenderness  Extremities:   Extremities normal, atraumatic, no cyanosis or edema  Pulses:   2+ and symmetric all extremities  Skin:   Skin color, texture, turgor normal, no rashes or lesions  Lymph nodes:   Cervical, supraclavicular, and axillary nodes normal  Neurologic:   CNII-XII intact. Normal strength, sensation and reflexes      throughout          Assessment & Plan:

## 2013-03-21 NOTE — Progress Notes (Signed)
Pre visit review using our clinic review tool, if applicable. No additional management support is needed unless otherwise documented below in the visit note. 

## 2013-05-05 ENCOUNTER — Encounter: Payer: Self-pay | Admitting: Cardiology

## 2013-05-05 ENCOUNTER — Encounter: Payer: Self-pay | Admitting: Family Medicine

## 2013-05-06 ENCOUNTER — Other Ambulatory Visit: Payer: Self-pay

## 2013-05-06 MED ORDER — SIMVASTATIN 40 MG PO TABS: 40.0000 mg | ORAL_TABLET | Freq: Every day | ORAL | Status: DC

## 2013-05-09 ENCOUNTER — Other Ambulatory Visit: Payer: Self-pay | Admitting: General Practice

## 2013-05-09 MED ORDER — TADALAFIL 20 MG PO TABS: 20.0000 mg | ORAL_TABLET | Freq: Every day | ORAL | Status: DC | PRN

## 2013-05-09 NOTE — Telephone Encounter (Signed)
Med filled.  

## 2013-05-13 ENCOUNTER — Telehealth: Payer: Self-pay | Admitting: *Deleted

## 2013-05-13 NOTE — Telephone Encounter (Signed)
PA paperwork for Cialis completed and faxed. Awaiting response. JG//CMA

## 2013-06-17 ENCOUNTER — Other Ambulatory Visit: Payer: Self-pay | Admitting: General Practice

## 2013-06-17 MED ORDER — TADALAFIL 20 MG PO TABS: 10.0000 mg | ORAL_TABLET | ORAL | Status: DC | PRN

## 2013-06-17 NOTE — Addendum Note (Signed)
Addended by: Kris Hartmann on: 06/17/2013 10:56 AM   Modules accepted: Orders

## 2013-06-17 NOTE — Telephone Encounter (Signed)
Noted and filled

## 2013-12-22 ENCOUNTER — Encounter: Payer: Self-pay | Admitting: Cardiology

## 2013-12-22 ENCOUNTER — Ambulatory Visit (INDEPENDENT_AMBULATORY_CARE_PROVIDER_SITE_OTHER): Payer: BC Managed Care – PPO | Admitting: Cardiology

## 2013-12-22 VITALS — BP 138/86 | HR 62 | Ht 71.0 in | Wt 228.1 lb

## 2013-12-22 DIAGNOSIS — E785 Hyperlipidemia, unspecified: Secondary | ICD-10-CM

## 2013-12-22 DIAGNOSIS — I2510 Atherosclerotic heart disease of native coronary artery without angina pectoris: Secondary | ICD-10-CM

## 2013-12-22 MED ORDER — ATORVASTATIN CALCIUM 40 MG PO TABS: 40.0000 mg | ORAL_TABLET | Freq: Every day | ORAL | Status: DC

## 2013-12-22 NOTE — Assessment & Plan Note (Signed)
Last year I had recommended changing to atorvastatin 40. His LDL was in the 80 range and after further discussion he decided to stay on simvastatin 40. However over time his LDL has gone higher. I've had a very full discussion with him about the guidelines. He has agreed to change to atorvastatin 40. In addition his labs will be checked if his physical exam in several months. If he is not in the range of goal, I will discuss the issue with him.

## 2013-12-22 NOTE — Progress Notes (Signed)
Patient ID: Eric Rosales, male   DOB: Mar 26, 1956, 58 y.o.   MRN: [id number]    HPI  patient is seen today to followup coronary disease. I saw him last September, 2014. He's had intervention in the past. He's doing very well. He's not having any significant symptoms.  No Known Allergies  Current Outpatient Prescriptions  Medication Sig Dispense Refill  . Ascorbic Acid (VITAMIN C) 500 MG tablet Take 500 mg by mouth daily.        Marland Kitchen aspirin 81 MG tablet Take 1 tablet (81 mg total) by mouth daily.      . Multiple Vitamin (MULTIVITAMIN) capsule Take 1 capsule by mouth daily.        . nitroGLYCERIN (NITROSTAT) 0.4 MG SL tablet Place 0.4 mg under the tongue every 5 (five) minutes as needed.        . sildenafil (VIAGRA) 100 MG tablet Take 1 tablet (100 mg total) by mouth daily as needed.  15 tablet  3  . simvastatin (ZOCOR) 40 MG tablet Take 1 tablet (40 mg total) by mouth at bedtime.  90 tablet  2  . tadalafil (CIALIS) 20 MG tablet Take 0.5-1 tablets (10-20 mg total) by mouth every other day as needed for erectile dysfunction.  18 tablet  0   No current facility-administered medications for this visit.    History   Social History  . Marital Status: Married    Spouse Name: N/A    Number of Children: N/A  . Years of Education: N/A   Occupational History  . Not on file.   Social History Main Topics  . Smoking status: Never Smoker   . Smokeless tobacco: Not on file  . Alcohol Use: 0.6 oz/week    1 Glasses of wine per week  . Drug Use: No  . Sexual Activity: Not on file   Other Topics Concern  . Not on file   Social History Narrative  . No narrative on file    Family History  Problem Relation Age of Onset  . Hypertension    . Diabetes Mother   . Diabetes Sister   . Diabetes Brother     Past Medical History  Diagnosis Date  . Hyperlipidemia   . CAD (coronary artery disease)     January, 2012, DES proximal dominant LAD at the diagonal takeoff ( P2Y12-no inhibition by Plavix)  Effient used  . Weight gain     October, 2012  . Easy bruisability     October, 2012    History reviewed. No pertinent past surgical history.  Patient Active Problem List   Diagnosis Date Noted  . Weight gain     Priority: High  . Easy bruisability     Priority: High  . CAD (coronary artery disease)     Priority: High  . HYPERLIPIDEMIA 06/15/2006    Priority: High  . Routine general medical examination at a health care facility 03/19/2012  . NEOPLASM, SKIN, UNCERTAIN BEHAVIOR 76/28/3151  . CORNS AND CALLUSES 01/17/2010  . NONSPEC ELEVATION OF LEVELS OF TRANSAMINASE/LDH 11/02/2008  . ERECTILE DYSFUNCTION 01/12/2007  . RECTAL BLEEDING 01/12/2007    ROS   patient denies fever, chills, headache, sweats, rash, change in vision, change in hearing, chest pain, cough, nausea vomiting, urinary symptoms. All other systems are reviewed and are negative.  PHYSICAL EXAM  patient is overweight but stable. He is oriented to person time and place. Affect is normal. Lungs are clear. Respiratory effort is nonlabored. Head is atraumatic. Sclera  and conjunctiva are normal. There is no jugulovenous distention. Cardiac exam reveals S1 and S2. There no clicks or significant murmurs. The abdomen is soft. Is no peripheral edema.  Filed Vitals:   12/22/13 1617  BP: 138/86  Pulse: 62  Height: 5\' 11"  (1.803 m)  Weight: 228 lb 1.9 oz (103.475 kg)   EKG is done today and reviewed by me. He has old changes that are unchanged from the past. This includes T wave inversions in leads 2 / 3 and aVF and across the precordium.  ASSESSMENT & PLAN

## 2013-12-22 NOTE — Addendum Note (Signed)
Addended by: Dennie Fetters on: 12/22/2013 05:27 PM   Modules accepted: Orders

## 2013-12-22 NOTE — Patient Instructions (Signed)
Your physician has recommended you make the following change in your medication: stop taking Simvastatin and start taking Atorvastatin 40 mg at bedtime.  Your physician wants you to follow-up in: 1 year. You will receive a reminder letter in the mail two months in advance. If you don't receive a letter, please call our office to schedule the follow-up appointment.

## 2013-12-22 NOTE — Assessment & Plan Note (Signed)
Coronary disease is stable. His intervention was January, 2012. He does not need any further workup at this time.

## 2013-12-30 ENCOUNTER — Encounter: Payer: Self-pay | Admitting: Family Medicine

## 2014-01-03 ENCOUNTER — Encounter: Payer: Self-pay | Admitting: Internal Medicine

## 2014-01-13 ENCOUNTER — Other Ambulatory Visit: Payer: Self-pay

## 2014-05-01 ENCOUNTER — Encounter: Payer: Self-pay | Admitting: *Deleted

## 2014-05-01 ENCOUNTER — Telehealth: Payer: Self-pay | Admitting: *Deleted

## 2014-05-01 NOTE — Telephone Encounter (Signed)
Pre-Visit Call:  Patient unavailable, left message for patient to return call.  eal

## 2014-05-01 NOTE — Addendum Note (Signed)
Addended by: Leticia Penna A on: 05/01/2014 03:14 PM   Modules accepted: Medications

## 2014-05-01 NOTE — Telephone Encounter (Addendum)
Pre- Visit Call:  Reviewed allergies, medications, health history, and health maintenance with patient.   Preferred pharmacy: [id number] [place], [place]- 11/10/07- Dr. [name] with [facility], 69mm cecal polyp removed (to return in 5 years), patient has not had repeat PSA- 03/21/13- with Dr. Birdie Riddle, normal Flu- declines Td- declines  Questions or concerns: none at this time

## 2014-05-01 NOTE — Telephone Encounter (Signed)
Pt returning your call please call work [phone]

## 2014-05-02 ENCOUNTER — Ambulatory Visit (INDEPENDENT_AMBULATORY_CARE_PROVIDER_SITE_OTHER): Payer: BLUE CROSS/BLUE SHIELD | Admitting: Family Medicine

## 2014-05-02 ENCOUNTER — Encounter: Payer: Self-pay | Admitting: Family Medicine

## 2014-05-02 VITALS — BP 132/82 | HR 64 | Temp 98.1°F | Resp 16 | Ht 70.25 in | Wt 234.1 lb

## 2014-05-02 DIAGNOSIS — Z0000 Encounter for general adult medical examination without abnormal findings: Secondary | ICD-10-CM

## 2014-05-02 DIAGNOSIS — E785 Hyperlipidemia, unspecified: Secondary | ICD-10-CM

## 2014-05-02 DIAGNOSIS — E669 Obesity, unspecified: Secondary | ICD-10-CM | POA: Insufficient documentation

## 2014-05-02 LAB — LIPID PANEL
Cholesterol: 157 mg/dL (ref 0–200)
HDL: 44.3 mg/dL (ref >39.00)
LDL Cholesterol: 84 mg/dL (ref 0–99)
NonHDL: 112.7
TRIGLYCERIDES: 144 mg/dL (ref 0.0–149.0)
Total CHOL/HDL Ratio: 4
VLDL: 28.8 mg/dL (ref 0.0–40.0)

## 2014-05-02 LAB — BASIC METABOLIC PANEL
BUN: 11 mg/dL (ref 6–23)
CALCIUM: 9.4 mg/dL (ref 8.4–10.5)
CO2: 30 mEq/L (ref 19–32)
Chloride: 103 mEq/L (ref 96–112)
Creatinine, Ser: 1.25 mg/dL (ref 0.40–1.50)
GFR: 76.19 mL/min (ref >60.00)
GLUCOSE: 106 mg/dL — AB (ref 70–99)
Potassium: 4.1 mEq/L (ref 3.5–5.1)
SODIUM: 138 meq/L (ref 135–145)

## 2014-05-02 LAB — CBC WITH DIFFERENTIAL/PLATELET
Basophils Absolute: 0 10*3/uL (ref 0.0–0.1)
Basophils Relative: 0.4 % (ref 0.0–3.0)
EOS ABS: 0.1 10*3/uL (ref 0.0–0.7)
EOS PCT: 2.2 % (ref 0.0–5.0)
HEMATOCRIT: 42.6 % (ref 39.0–52.0)
HEMOGLOBIN: 14.5 g/dL (ref 13.0–17.0)
Lymphocytes Relative: 26.2 % (ref 12.0–46.0)
Lymphs Abs: 1.6 10*3/uL (ref 0.7–4.0)
MCHC: 34 g/dL (ref 30.0–36.0)
MCV: 89.9 fl (ref 78.0–100.0)
MONO ABS: 0.8 10*3/uL (ref 0.1–1.0)
Monocytes Relative: 12.5 % — ABNORMAL HIGH (ref 3.0–12.0)
NEUTROS ABS: 3.7 10*3/uL (ref 1.4–7.7)
NEUTROS PCT: 58.7 % (ref 43.0–77.0)
Platelets: 168 10*3/uL (ref 150.0–400.0)
RBC: 4.74 Mil/uL (ref 4.22–5.81)
RDW: 15.4 % (ref 11.5–15.5)
WBC: 6.3 10*3/uL (ref 4.0–10.5)

## 2014-05-02 LAB — HEPATIC FUNCTION PANEL
ALBUMIN: 4.3 g/dL (ref 3.5–5.2)
ALT: 34 U/L (ref 0–53)
AST: 23 U/L (ref 0–37)
Alkaline Phosphatase: 101 U/L (ref 39–117)
BILIRUBIN TOTAL: 0.9 mg/dL (ref 0.2–1.2)
Bilirubin, Direct: 0.2 mg/dL (ref 0.0–0.3)
TOTAL PROTEIN: 7.4 g/dL (ref 6.0–8.3)

## 2014-05-02 LAB — PSA: PSA: 0.47 ng/mL (ref 0.10–4.00)

## 2014-05-02 LAB — TSH: TSH: 2.47 u[IU]/mL (ref 0.35–4.50)

## 2014-05-02 NOTE — Assessment & Plan Note (Signed)
Pt's PE WNL.  UTD on colonoscopy- reports that GI did not do call back at 5 yrs and is waiting for 10.  Check labs.  Anticipatory guidance provided.

## 2014-05-02 NOTE — Assessment & Plan Note (Signed)
New.  Encouraged regular, aerobic activity for 30 minutes at least 4x/week and monitoring caloric intake w/ the help of My[employer]itnessPal app.

## 2014-05-02 NOTE — Patient Instructions (Signed)
Follow up in 6 months to recheck cholesterol We'll notify you of your lab results and make any changes if needed Try and make healthy food choices and get regular exercise Call with any questions or concerns Happy New Year!!

## 2014-05-02 NOTE — Progress Notes (Signed)
Pre visit review using our clinic review tool, if applicable. No additional management support is needed unless otherwise documented below in the visit note. 

## 2014-05-02 NOTE — Progress Notes (Signed)
   Subjective:    Patient ID: Eric Rosales, male    DOB: [birth year], 59 y.o.   MRN: [id number]  HPI CPE- UTD on colonoscopy.  No current concerns.   Review of Systems Patient reports no vision/hearing changes, anorexia, fever ,adenopathy, persistant/recurrent hoarseness, swallowing issues, chest pain, palpitations, edema, persistant/recurrent cough, hemoptysis, dyspnea (rest,exertional, paroxysmal nocturnal), gastrointestinal  bleeding (melena, rectal bleeding), abdominal pain, excessive heart burn, GU symptoms (dysuria, hematuria, voiding/incontinence issues) syncope, focal weakness, memory loss, numbness & tingling, skin/hair/nail changes, depression, anxiety, abnormal bruising/bleeding, musculoskeletal symptoms/signs.   Reviewed meds, allergies, problem list, and PMH in chart     Objective:   Physical Exam BP 132/82 mmHg  Pulse 64  Temp(Src) 98.1 F (36.7 C) (Oral)  Resp 16  Ht 5' 10.25" (1.784 m)  Wt 234 lb 2 oz (106.198 kg)  BMI 33.37 kg/m2  SpO2 98%  General Appearance:    Alert, cooperative, no distress, appears stated age  Head:    Normocephalic, without obvious abnormality, atraumatic  Eyes:    PERRL, conjunctiva/corneas clear, EOM's intact, fundi    benign, both eyes       Ears:    Normal TM's and external ear canals, both ears  Nose:   Nares normal, septum midline, mucosa normal, no drainage   or sinus tenderness  Throat:   Lips, mucosa, and tongue normal; teeth and gums normal  Neck:   Supple, symmetrical, trachea midline, no adenopathy;       thyroid:  No enlargement/tenderness/nodules  Back:     Symmetric, no curvature, ROM normal, no CVA tenderness  Lungs:     Clear to auscultation bilaterally, respirations unlabored  Chest wall:    No tenderness or deformity  Heart:    Regular rate and rhythm, S1 and S2 normal, no murmur, rub   or gallop  Abdomen:     Soft, non-tender, bowel sounds active all four quadrants,    no masses, no organomegaly  Genitalia:    Normal  male without lesion, discharge or tenderness  Rectal:    Normal tone, normal prostate, no masses or tenderness  Extremities:   Extremities normal, atraumatic, no cyanosis or edema  Pulses:   2+ and symmetric all extremities  Skin:   Skin color, texture, turgor normal, no rashes or lesions  Lymph nodes:   Cervical, supraclavicular, and axillary nodes normal  Neurologic:   CNII-XII intact. Normal strength, sensation and reflexes      throughout          Assessment & Plan:

## 2014-05-03 ENCOUNTER — Telehealth: Payer: Self-pay | Admitting: General Practice

## 2014-05-03 NOTE — Telephone Encounter (Signed)
Spoke with patient who advised that Insurance told him that they would no longer cover his viagra, stated that he needed to be on cialis. Pt received an Rx for this medication, now insurance is stating that it needs a PA. Not sure if it has a quantity limit?

## 2014-05-05 NOTE — Telephone Encounter (Signed)
PA initiated

## 2014-05-10 ENCOUNTER — Encounter: Payer: Self-pay | Admitting: Family Medicine

## 2014-08-03 ENCOUNTER — Other Ambulatory Visit: Payer: Self-pay | Admitting: *Deleted

## 2014-08-03 ENCOUNTER — Telehealth: Payer: Self-pay | Admitting: *Deleted

## 2014-08-03 MED ORDER — TADALAFIL 20 MG PO TABS: 10.0000 mg | ORAL_TABLET | ORAL | Status: DC | PRN

## 2014-08-03 NOTE — Telephone Encounter (Signed)
error 

## 2014-12-07 ENCOUNTER — Other Ambulatory Visit: Payer: Self-pay | Admitting: Cardiology

## 2014-12-22 ENCOUNTER — Other Ambulatory Visit: Payer: Self-pay | Admitting: Family Medicine

## 2014-12-22 ENCOUNTER — Ambulatory Visit (INDEPENDENT_AMBULATORY_CARE_PROVIDER_SITE_OTHER): Payer: BLUE CROSS/BLUE SHIELD | Admitting: Cardiology

## 2014-12-22 ENCOUNTER — Encounter: Payer: Self-pay | Admitting: Cardiology

## 2014-12-22 ENCOUNTER — Telehealth: Payer: Self-pay | Admitting: Cardiology

## 2014-12-22 VITALS — Ht 70.25 in | Wt 233.0 lb

## 2014-12-22 DIAGNOSIS — R635 Abnormal weight gain: Secondary | ICD-10-CM

## 2014-12-22 DIAGNOSIS — I2510 Atherosclerotic heart disease of native coronary artery without angina pectoris: Secondary | ICD-10-CM | POA: Diagnosis not present

## 2014-12-22 DIAGNOSIS — E785 Hyperlipidemia, unspecified: Secondary | ICD-10-CM | POA: Diagnosis not present

## 2014-12-22 NOTE — Assessment & Plan Note (Signed)
I have encouraged the patient to lose weight.  

## 2014-12-22 NOTE — Patient Instructions (Signed)
Medication Instructions:  Same-no changes  Labwork: None  Testing/Procedures: None  Follow-Up: Your physician wants you to follow-up in: 1 year f/u with Dr Varanasi/Former Dr Ron Parker pt. You will receive a reminder letter in the mail two months in advance. If you don't receive a letter, please call our office to schedule the follow-up appointment.

## 2014-12-22 NOTE — Assessment & Plan Note (Signed)
Patient had a drug-eluting stent to the LAD in 2012. He is stable and doing well. I encouraged him to lose some weight and continued exercise. No further workup at this time.

## 2014-12-22 NOTE — Telephone Encounter (Signed)
Medication filled to pharmacy as requested.   

## 2014-12-22 NOTE — Assessment & Plan Note (Signed)
The patient is on atorvastatin with a good reduction in his LDL. No change in therapy.

## 2014-12-22 NOTE — Telephone Encounter (Signed)
New Message        Pt calling stating that he was suppose to get a copy of his EKG and didn't receive it while in the office. The pt states he just left and isn't far and wants to know if he can come back to get it. Please call back and advise.

## 2014-12-22 NOTE — Progress Notes (Signed)
Cardiology Office Note   Date:  12/22/2014   ID:  Eric Rosales, DOB 21-Feb-1956, MRN [id number]  PCP:  Eric Asa, MD  Cardiologist:  Eric Argyle, MD   Chief Complaint  Patient presents with  . Appointment    Follow-up coronary disease      History of Present Illness: Eric Rosales is a 59 y.o. male who presents today to follow-up coronary artery disease. He is doing very well. I saw him last September, 2015. He underwent a coronary intervention in 2012 with a drug-eluting stent to the proximal dominant LAD. He is not having any symptoms. Last year I was able to convince him to change to atorvastatin. He is now on appropriate dosing and his LDL is down to 84.    Past Medical History  Diagnosis Date  . Hyperlipidemia   . CAD (coronary artery disease)     January, 2012, DES proximal dominant LAD at the diagonal takeoff ( P2Y12-no inhibition by Plavix) Effient used  . Weight gain     October, 2012  . Easy bruisability     October, 2012    Past Surgical History  Procedure Laterality Date  . Coronary stent placement  2012    Patient Active Problem List   Diagnosis Date Noted  . Weight gain     Priority: High  . Easy bruisability     Priority: High  . CAD (coronary artery disease)     Priority: High  . HYPERLIPIDEMIA 06/15/2006    Priority: High  . Obesity (BMI 30-39.9) 05/02/2014  . Routine general medical examination at a health care facility 03/19/2012  . NEOPLASM, SKIN, UNCERTAIN BEHAVIOR 50/38/8828  . CORNS AND CALLUSES 01/17/2010  . NONSPEC ELEVATION OF LEVELS OF TRANSAMINASE/LDH 11/02/2008  . ERECTILE DYSFUNCTION 01/12/2007  . RECTAL BLEEDING 01/12/2007      Current Outpatient Prescriptions  Medication Sig Dispense Refill  . Ascorbic Acid (VITAMIN C) 500 MG tablet Take 500 mg by mouth daily.      Marland Kitchen aspirin 81 MG tablet Take 1 tablet (81 mg total) by mouth daily.    Marland Kitchen atorvastatin (LIPITOR) 40 MG tablet TAKE 1 TABLET (40 MG TOTAL) BY MOUTH DAILY. 30  tablet 0  . CIALIS 20 MG tablet TAKE 1/2-1 TABLET BY MOUTH EVERY OTHER DAY AS NEEDED 6 tablet 2  . Multiple Vitamin (MULTIVITAMIN) capsule Take 1 capsule by mouth daily.      . nitroGLYCERIN (NITROSTAT) 0.4 MG SL tablet Place 0.4 mg under the tongue every 5 (five) minutes as needed for chest pain.      No current facility-administered medications for this visit.    Allergies:   Review of patient's allergies indicates no known allergies.    Social History:  The patient  reports that he has never smoked. He does not have any smokeless tobacco history on file. He reports that he drinks about 0.6 oz of alcohol per week. He reports that he does not use illicit drugs.   Family History:  The patient's family history includes Diabetes in his brother, mother, and sister; Hypertension in an other family member; Stroke in his mother. There is no history of Heart attack.    ROS:  Please see the history of present illness.    Patient denies fever, chills, headache, sweats, rash, change in vision, change in hearing, chest pain, cough, nausea or vomiting, urinary symptoms. All other systems are reviewed and are negative.    PHYSICAL EXAM: VS:  Ht 5' 10.25" (1.784 m)  Wt 233 lb (105.688 kg)  BMI 33.21 kg/m2 , Patient is oriented to person time and place. Affect is normal. Head is atraumatic. Sclera and conjunctiva are normal. There is no jugular venous distention. Lungs are clear. Respiratory effort is nonlabored. Cardiac exam reveals S1 and S2. The abdomen is soft. There is no peripheral edema. There are no musculoskeletal deformities. There are no skin rashes.   EKG:   EKG is done today and reviewed by me. The patient has old diffuse ST-T wave changes. There is no change from the past.   Recent Labs: 05/02/2014: ALT 34; BUN 11; Creatinine, Ser 1.25; Hemoglobin 14.5; Platelets 168.0; Potassium 4.1; Sodium 138; TSH 2.47    Lipid Panel    Component Value Date/Time   CHOL 157 05/02/2014 0945   TRIG  144.0 05/02/2014 0945   HDL 44.30 05/02/2014 0945   CHOLHDL 4 05/02/2014 0945   VLDL 28.8 05/02/2014 0945   LDLCALC 84 05/02/2014 0945   LDLDIRECT 139.5 03/19/2012 1059      Wt Readings from Last 3 Encounters:  12/22/14 233 lb (105.688 kg)  05/02/14 234 lb 2 oz (106.198 kg)  12/22/13 228 lb 1.9 oz (103.475 kg)      Current medicines are reviewed  The patient understands his medications.     ASSESSMENT AND PLAN:

## 2014-12-22 NOTE — Telephone Encounter (Signed)
The pt was to far away to come back to the office by the time I received this message. Per his request I have mailed a copy of his EKG to his address.

## 2015-01-24 ENCOUNTER — Other Ambulatory Visit: Payer: Self-pay | Admitting: Cardiology

## 2015-04-16 ENCOUNTER — Telehealth: Payer: Self-pay | Admitting: Family Medicine

## 2015-04-16 NOTE — Telephone Encounter (Signed)
Pt scheduled cpe thru mychart for 05/10/15 11:00am. Not sure how it was open thru Mychart. Do you want me to call to reschedule?

## 2015-04-16 NOTE — Telephone Encounter (Signed)
Ok for pt to keep appt but please make sure that 11:00 slots are held for hospital f/u's.

## 2015-05-09 ENCOUNTER — Telehealth: Payer: Self-pay | Admitting: Behavioral Health

## 2015-05-09 ENCOUNTER — Encounter: Payer: Self-pay | Admitting: Behavioral Health

## 2015-05-09 NOTE — Telephone Encounter (Signed)
Pre-Visit Call completed with patient and chart updated.   Pre-Visit Info documented in Specialty Comments under [employer].    

## 2015-05-09 NOTE — Telephone Encounter (Signed)
Unable to reach patient at time of Pre-Visit Call.  Left message for patient to return call when available.    

## 2015-05-09 NOTE — Addendum Note (Signed)
Addended by: Kathlen Brunswick on: 05/09/2015 04:04 PM   Modules accepted: Medications

## 2015-05-10 ENCOUNTER — Encounter: Payer: Self-pay | Admitting: Family Medicine

## 2015-05-10 ENCOUNTER — Ambulatory Visit (INDEPENDENT_AMBULATORY_CARE_PROVIDER_SITE_OTHER): Payer: BLUE CROSS/BLUE SHIELD | Admitting: Family Medicine

## 2015-05-10 VITALS — BP 132/90 | HR 67 | Temp 97.8°F | Ht 70.0 in | Wt 237.8 lb

## 2015-05-10 DIAGNOSIS — Z1211 Encounter for screening for malignant neoplasm of colon: Secondary | ICD-10-CM

## 2015-05-10 DIAGNOSIS — Z23 Encounter for immunization: Secondary | ICD-10-CM

## 2015-05-10 DIAGNOSIS — Z0000 Encounter for general adult medical examination without abnormal findings: Secondary | ICD-10-CM

## 2015-05-10 LAB — CBC WITH DIFFERENTIAL/PLATELET
BASOS ABS: 0 10*3/uL (ref 0.0–0.1)
Basophils Relative: 0.4 % (ref 0.0–3.0)
EOS ABS: 0.1 10*3/uL (ref 0.0–0.7)
Eosinophils Relative: 2.1 % (ref 0.0–5.0)
HCT: 43.2 % (ref 39.0–52.0)
Hemoglobin: 14.3 g/dL (ref 13.0–17.0)
LYMPHS ABS: 1.8 10*3/uL (ref 0.7–4.0)
Lymphocytes Relative: 30 % (ref 12.0–46.0)
MCHC: 33.1 g/dL (ref 30.0–36.0)
MCV: 91.7 fl (ref 78.0–100.0)
MONO ABS: 0.7 10*3/uL (ref 0.1–1.0)
Monocytes Relative: 11.2 % (ref 3.0–12.0)
NEUTROS PCT: 56.3 % (ref 43.0–77.0)
Neutro Abs: 3.4 10*3/uL (ref 1.4–7.7)
Platelets: 159 10*3/uL (ref 150.0–400.0)
RBC: 4.71 Mil/uL (ref 4.22–5.81)
RDW: 16 % — ABNORMAL HIGH (ref 11.5–15.5)
WBC: 6 10*3/uL (ref 4.0–10.5)

## 2015-05-10 LAB — PSA: PSA: 0.47 ng/mL (ref 0.10–4.00)

## 2015-05-10 LAB — HEPATIC FUNCTION PANEL
ALBUMIN: 4.3 g/dL (ref 3.5–5.2)
ALK PHOS: 101 U/L (ref 39–117)
ALT: 46 U/L (ref 0–53)
AST: 24 U/L (ref 0–37)
Bilirubin, Direct: 0.2 mg/dL (ref 0.0–0.3)
TOTAL PROTEIN: 7.6 g/dL (ref 6.0–8.3)
Total Bilirubin: 0.8 mg/dL (ref 0.2–1.2)

## 2015-05-10 LAB — BASIC METABOLIC PANEL
BUN: 13 mg/dL (ref 6–23)
CHLORIDE: 102 meq/L (ref 96–112)
CO2: 31 mEq/L (ref 19–32)
CREATININE: 1.29 mg/dL (ref 0.40–1.50)
Calcium: 9.6 mg/dL (ref 8.4–10.5)
GFR: 73.21 mL/min (ref >60.00)
GLUCOSE: 100 mg/dL — AB (ref 70–99)
POTASSIUM: 4.2 meq/L (ref 3.5–5.1)
Sodium: 138 mEq/L (ref 135–145)

## 2015-05-10 LAB — LIPID PANEL
CHOLESTEROL: 151 mg/dL (ref 0–200)
HDL: 44 mg/dL (ref >39.00)
LDL CALC: 90 mg/dL (ref 0–99)
NonHDL: 106.52
Total CHOL/HDL Ratio: 3
Triglycerides: 82 mg/dL (ref 0.0–149.0)
VLDL: 16.4 mg/dL (ref 0.0–40.0)

## 2015-05-10 LAB — TSH: TSH: 1.8 u[IU]/mL (ref 0.35–4.50)

## 2015-05-10 NOTE — Patient Instructions (Signed)
Follow up in 6 months to recheck cholesterol We'll notify you of your lab results and make any changes if needed We'll call you with your GI appt for the colonoscopy Continue to work on healthy diet and regular exercise- you can do it! Call with any questions or concerns If you want to join [place] at the new [place] office, any scheduled appointments will automatically transfer and we will see you at [address] [place] [address] [place],  [id number] [facility]) Happy Valentine's Day!

## 2015-05-10 NOTE — Progress Notes (Signed)
   Subjective:    Patient ID: Eric Rosales, male    DOB: [birth year], 60 y.o.   MRN: [id number]  HPI CPE- pt is overdue for colonoscopy [name]), due for Tdap.  walking regularly on the treadmill, golfing regularly when warm.    Review of Systems Patient reports no vision/hearing changes, anorexia, fever ,adenopathy, persistant/recurrent hoarseness, swallowing issues, chest pain, palpitations, edema, persistant/recurrent cough, hemoptysis, dyspnea (rest,exertional, paroxysmal nocturnal), gastrointestinal  bleeding (melena, rectal bleeding), abdominal pain, excessive heart burn, GU symptoms (dysuria, hematuria, voiding/incontinence issues) syncope, focal weakness, memory loss, numbness & tingling, skin/hair/nail changes, depression, anxiety, abnormal bruising/bleeding, musculoskeletal symptoms/signs.     Objective:   Physical Exam BP 132/90 mmHg  Pulse 67  Temp(Src) 97.8 F (36.6 C) (Oral)  Ht 5\' 10"  (1.778 m)  Wt 237 lb 12.8 oz (107.865 kg)  BMI 34.12 kg/m2  SpO2 98%  General Appearance:    Alert, cooperative, no distress, appears stated age  Head:    Normocephalic, without obvious abnormality, atraumatic  Eyes:    PERRL, conjunctiva/corneas clear, EOM's intact, fundi    benign, both eyes       Ears:    Normal TM's and external ear canals, both ears  Nose:   Nares normal, septum midline, mucosa normal, no drainage   or sinus tenderness  Throat:   Lips, mucosa, and tongue normal; teeth and gums normal  Neck:   Supple, symmetrical, trachea midline, no adenopathy;       thyroid:  No enlargement/tenderness/nodules  Back:     Symmetric, no curvature, ROM normal, no CVA tenderness  Lungs:     Clear to auscultation bilaterally, respirations unlabored  Chest wall:    No tenderness or deformity  Heart:    Regular rate and rhythm, S1 and S2 normal, no murmur, rub   or gallop  Abdomen:     Soft, non-tender, bowel sounds active all four quadrants,    no masses, no organomegaly  Genitalia:     Normal male without lesion, discharge or tenderness  Rectal:    Normal tone, normal prostate, no masses or tenderness;   guaiac negative stool  Extremities:   Extremities normal, atraumatic, no cyanosis or edema  Pulses:   2+ and symmetric all extremities  Skin:   Skin color, texture, turgor normal, no rashes or lesions  Lymph nodes:   Cervical, supraclavicular, and axillary nodes normal  Neurologic:   CNII-XII intact. Normal strength, sensation and reflexes      throughout          Assessment & Plan:

## 2015-05-10 NOTE — Progress Notes (Signed)
Pre visit review using our clinic review tool, if applicable. No additional management support is needed unless otherwise documented below in the visit note. 

## 2015-05-10 NOTE — Assessment & Plan Note (Signed)
Pt's PE WNL w/ exception of obesity.  Overdue for colonoscopy- order entered.  Due for Tdap- given today.  Pt declines flu.  Check labs.  Anticipatory guidance provided.

## 2015-06-12 ENCOUNTER — Telehealth: Payer: Self-pay | Admitting: Family Medicine

## 2015-06-12 NOTE — Telephone Encounter (Signed)
LVM inquiring if patient received flu shot  °

## 2015-11-09 ENCOUNTER — Other Ambulatory Visit: Payer: Self-pay | Admitting: Family Medicine

## 2015-11-12 ENCOUNTER — Other Ambulatory Visit: Payer: Self-pay

## 2015-11-12 NOTE — Telephone Encounter (Signed)
Medication Detail    Disp Refills Start End   atorvastatin (LIPITOR) 40 MG tablet 30 tablet 10 01/24/2015    Sig: TAKE 1 TABLET (40 MG TOTAL) BY MOUTH DAILY.   E-Prescribing Status: Receipt confirmed by pharmacy (01/24/2015 2:09 PM EDT)   Pharmacy   [id number] - [place], [place] - [address]

## 2015-12-07 ENCOUNTER — Other Ambulatory Visit: Payer: Self-pay | Admitting: Interventional Cardiology

## 2015-12-30 NOTE — Progress Notes (Signed)
Cardiology Office Note   Date:  12/31/2015   ID:  Eric Rosales, DOB [birth year], MRN [id number]  PCP:  Annye Asa, MD    No chief complaint on file. CAD   Wt Readings from Last 3 Encounters:  12/31/15 235 lb (106.6 kg)  05/10/15 237 lb 12.8 oz (107.9 kg)  12/22/14 233 lb (105.7 kg)       History of Present Illness: Eric Rosales is a 60 y.o. male  Who underwent a coronary intervention in 2012 with a drug-eluting stent to the proximal dominant LAD. Plavix resistant so Effient was used.  His angina was exercise induced nausea, relieved with rest.  THis prompted the cath.  He had a 3.5 x 20 Promus Element stent to the prox LAD.   Currently, no problems with exercise.  He plays golf, uses treadmill without problems.  No sx like prior angina.   Tolerating atorvastatin.      Past Medical History:  Diagnosis Date  . CAD (coronary artery disease)    January, 2012, DES proximal dominant LAD at the diagonal takeoff ( P2Y12-no inhibition by Plavix) Effient used  . Easy bruisability    October, 2012  . Hyperlipidemia   . Weight gain    October, 2012    Past Surgical History:  Procedure Laterality Date  . CORONARY STENT PLACEMENT  2012     Current Outpatient Prescriptions  Medication Sig Dispense Refill  . Ascorbic Acid (VITAMIN C) 500 MG tablet Take 500 mg by mouth daily.      Marland Kitchen aspirin 81 MG tablet Take 1 tablet (81 mg total) by mouth daily.    Marland Kitchen atorvastatin (LIPITOR) 40 MG tablet Take 1 tablet (40 mg total) by mouth daily. Please keep 12/31/15 appt for further refills 45 tablet 0  . CIALIS 20 MG tablet TAKE 1/2-1 TABLET BY MOUTH EVERY OTHER DAY AS NEEDED 6 tablet 2  . Multiple Vitamin (MULTIVITAMIN) capsule Take 1 capsule by mouth daily.      . nitroGLYCERIN (NITROSTAT) 0.4 MG SL tablet Place 0.4 mg under the tongue every 5 (five) minutes as needed for chest pain. 3 DOSES MAX    . tadalafil (CIALIS) 20 MG tablet Take 20 mg by mouth every other day.     No current  facility-administered medications for this visit.     Allergies:   Review of patient's allergies indicates no known allergies.    Social History:  The patient  reports that he has never smoked. He does not have any smokeless tobacco history on file. He reports that he drinks about 0.6 oz of alcohol per week . He reports that he does not use drugs.   Family History:  The patient's family history includes Diabetes in his brother, mother, and sister; Stroke in his mother.    ROS:  Please see the history of present illness.   Otherwise, review of systems are positive for rare joint pains.   All other systems are reviewed and negative.    PHYSICAL EXAM: VS:  BP 140/88   Pulse (!) 56   Ht 5\' 10"  (1.778 m)   Wt 235 lb (106.6 kg)   BMI 33.72 kg/m  , BMI Body mass index is 33.72 kg/m. GEN: Well nourished, well developed, in no acute distress  HEENT: normal  Neck: no JVD, carotid bruits, or masses Cardiac: RRR; no murmurs, rubs, or gallops,no edema  Respiratory:  clear to auscultation bilaterally, normal work of breathing GI: soft, nontender, nondistended, + BS MS:  no deformity or atrophy  Skin: warm and dry, no rash Neuro:  Strength and sensation are intact Psych: euthymic mood, full affect   EKG:   The ekg ordered today demonstrates NSR, nonspecific ST segment changes   Recent Labs: 05/10/2015: ALT 46; BUN 13; Creatinine, Ser 1.29; Hemoglobin 14.3; Platelets 159.0; Potassium 4.2; Sodium 138; TSH 1.80   Lipid Panel    Component Value Date/Time   CHOL 151 05/10/2015 1145   TRIG 82.0 05/10/2015 1145   HDL 44.00 05/10/2015 1145   CHOLHDL 3 05/10/2015 1145   VLDL 16.4 05/10/2015 1145   LDLCALC 90 05/10/2015 1145   LDLDIRECT 139.5 03/19/2012 1059     Other studies Reviewed: Additional studies/ records that were reviewed today with results demonstrating: cath results.   ASSESSMENT AND PLAN:  1. CAD : Plavix reistant.  Continue aspirin at this point.  No angina.  Call us if  anginal symptoms return. 2. Hyperlipidemia: Controlled.  COntinue atorvastatin.  3. Elevated BP, not HTN: follow at home. Call us for readings > 140/90. Recheck in the office 142/86.    Current medicines are reviewed at length with the patient today.  The patient concerns regarding his medicines were addressed.  The following changes have been made:  No change  Labs/ tests ordered today include:  No orders of the defined types were placed in this encounter.   Recommend 150 minutes/week of aerobic exercise Low fat, low carb, high fiber diet recommended  Disposition:   FU in 1 year   Signed, Larae Grooms, MD  12/31/2015 3:15 PM    [facility] [place], [place], [place]  [address] Phone: [phone]; Fax: [phone]

## 2015-12-31 ENCOUNTER — Encounter: Payer: Self-pay | Admitting: Interventional Cardiology

## 2015-12-31 ENCOUNTER — Ambulatory Visit (INDEPENDENT_AMBULATORY_CARE_PROVIDER_SITE_OTHER): Payer: BLUE CROSS/BLUE SHIELD | Admitting: Interventional Cardiology

## 2015-12-31 VITALS — BP 140/88 | HR 56 | Ht 70.0 in | Wt 235.0 lb

## 2015-12-31 DIAGNOSIS — R030 Elevated blood-pressure reading, without diagnosis of hypertension: Secondary | ICD-10-CM

## 2015-12-31 DIAGNOSIS — I2510 Atherosclerotic heart disease of native coronary artery without angina pectoris: Secondary | ICD-10-CM | POA: Diagnosis not present

## 2015-12-31 DIAGNOSIS — E7800 Pure hypercholesterolemia, unspecified: Secondary | ICD-10-CM | POA: Diagnosis not present

## 2015-12-31 NOTE — Patient Instructions (Signed)

## 2016-01-31 ENCOUNTER — Other Ambulatory Visit: Payer: Self-pay | Admitting: Interventional Cardiology

## 2016-05-28 ENCOUNTER — Encounter: Payer: Self-pay | Admitting: Family Medicine

## 2016-09-05 ENCOUNTER — Ambulatory Visit (INDEPENDENT_AMBULATORY_CARE_PROVIDER_SITE_OTHER): Payer: BLUE CROSS/BLUE SHIELD | Admitting: Family Medicine

## 2016-09-05 ENCOUNTER — Encounter: Payer: Self-pay | Admitting: Family Medicine

## 2016-09-05 VITALS — BP 120/81 | HR 65 | Temp 98.2°F | Resp 16 | Ht 70.0 in | Wt 232.5 lb

## 2016-09-05 DIAGNOSIS — Z1211 Encounter for screening for malignant neoplasm of colon: Secondary | ICD-10-CM

## 2016-09-05 DIAGNOSIS — E7800 Pure hypercholesterolemia, unspecified: Secondary | ICD-10-CM | POA: Diagnosis not present

## 2016-09-05 DIAGNOSIS — Z0000 Encounter for general adult medical examination without abnormal findings: Secondary | ICD-10-CM

## 2016-09-05 LAB — CBC WITH DIFFERENTIAL/PLATELET
BASOS PCT: 0.3 % (ref 0.0–3.0)
Basophils Absolute: 0 10*3/uL (ref 0.0–0.1)
EOS ABS: 0.1 10*3/uL (ref 0.0–0.7)
EOS PCT: 1.7 % (ref 0.0–5.0)
HEMATOCRIT: 43.3 % (ref 39.0–52.0)
HEMOGLOBIN: 14.2 g/dL (ref 13.0–17.0)
LYMPHS PCT: 29.5 % (ref 12.0–46.0)
Lymphs Abs: 1.8 10*3/uL (ref 0.7–4.0)
MCHC: 32.9 g/dL (ref 30.0–36.0)
MCV: 91.4 fl (ref 78.0–100.0)
Monocytes Absolute: 0.7 10*3/uL (ref 0.1–1.0)
Monocytes Relative: 11.7 % (ref 3.0–12.0)
NEUTROS ABS: 3.4 10*3/uL (ref 1.4–7.7)
Neutrophils Relative %: 56.8 % (ref 43.0–77.0)
PLATELETS: 193 10*3/uL (ref 150.0–400.0)
RBC: 4.74 Mil/uL (ref 4.22–5.81)
RDW: 15.5 % (ref 11.5–15.5)
WBC: 6.1 10*3/uL (ref 4.0–10.5)

## 2016-09-05 LAB — LIPID PANEL
CHOLESTEROL: 131 mg/dL (ref 0–200)
HDL: 36.8 mg/dL — AB (ref >39.00)
LDL Cholesterol: 69 mg/dL (ref 0–99)
NONHDL: 94.33
Total CHOL/HDL Ratio: 4
Triglycerides: 128 mg/dL (ref 0.0–149.0)
VLDL: 25.6 mg/dL (ref 0.0–40.0)

## 2016-09-05 LAB — HEPATIC FUNCTION PANEL
ALT: 40 U/L (ref 0–53)
AST: 22 U/L (ref 0–37)
Albumin: 4.6 g/dL (ref 3.5–5.2)
Alkaline Phosphatase: 98 U/L (ref 39–117)
BILIRUBIN DIRECT: 0.2 mg/dL (ref 0.0–0.3)
BILIRUBIN TOTAL: 0.9 mg/dL (ref 0.2–1.2)
TOTAL PROTEIN: 7.8 g/dL (ref 6.0–8.3)

## 2016-09-05 LAB — BASIC METABOLIC PANEL
BUN: 12 mg/dL (ref 6–23)
CALCIUM: 9.7 mg/dL (ref 8.4–10.5)
CHLORIDE: 104 meq/L (ref 96–112)
CO2: 30 meq/L (ref 19–32)
Creatinine, Ser: 1.22 mg/dL (ref 0.40–1.50)
GFR: 77.73 mL/min (ref >60.00)
GLUCOSE: 89 mg/dL (ref 70–99)
Potassium: 4.1 mEq/L (ref 3.5–5.1)
SODIUM: 140 meq/L (ref 135–145)

## 2016-09-05 LAB — TSH: TSH: 1.28 u[IU]/mL (ref 0.35–4.50)

## 2016-09-05 LAB — PSA: PSA: 0.62 ng/mL (ref 0.10–4.00)

## 2016-09-05 NOTE — Progress Notes (Signed)
   Subjective:    Patient ID: Eric Rosales, male    DOB: May 31, 1955, 61 y.o.   MRN: [id number]  HPI CPE- overdue for colonoscopy.  UTD on Tdap.     Review of Systems Patient reports no vision/hearing changes, anorexia, fever ,adenopathy, persistant/recurrent hoarseness, swallowing issues, chest pain, palpitations, edema, persistant/recurrent cough, hemoptysis, dyspnea (rest,exertional, paroxysmal nocturnal), gastrointestinal  bleeding (melena, rectal bleeding), abdominal pain, excessive heart burn, GU symptoms (dysuria, hematuria, voiding/incontinence issues) syncope, focal weakness, memory loss, numbness & tingling, skin/hair/nail changes, depression, anxiety, abnormal bruising/bleeding, musculoskeletal symptoms/signs.     Objective:   Physical Exam BP 120/81   Pulse 65   Temp 98.2 F (36.8 C) (Oral)   Resp 16   Ht 5\' 10"  (1.778 m)   Wt 232 lb 8 oz (105.5 kg)   SpO2 98%   BMI 33.36 kg/m   General Appearance:    Alert, cooperative, no distress, appears stated age  Head:    Normocephalic, without obvious abnormality, atraumatic  Eyes:    PERRL, conjunctiva/corneas clear, EOM's intact, fundi    benign, both eyes       Ears:    Normal TM's and external ear canals, both ears  Nose:   Nares normal, septum midline, mucosa normal, no drainage   or sinus tenderness  Throat:   Lips, mucosa, and tongue normal; teeth and gums normal  Neck:   Supple, symmetrical, trachea midline, no adenopathy;       thyroid:  No enlargement/tenderness/nodules  Back:     Symmetric, no curvature, ROM normal, no CVA tenderness  Lungs:     Clear to auscultation bilaterally, respirations unlabored  Chest wall:    No tenderness or deformity  Heart:    Regular rate and rhythm, S1 and S2 normal, no murmur, rub   or gallop  Abdomen:     Soft, non-tender, bowel sounds active all four quadrants,    no masses, no organomegaly  Genitalia:    Normal male without lesion, discharge or tenderness  Rectal:    Normal tone,  normal prostate, no masses or tenderness  Extremities:   Extremities normal, atraumatic, no cyanosis or edema  Pulses:   2+ and symmetric all extremities  Skin:   Skin color, texture, turgor normal, no rashes or lesions  Lymph nodes:   Cervical, supraclavicular, and axillary nodes normal  Neurologic:   CNII-XII intact. Normal strength, sensation and reflexes      throughout          Assessment & Plan:

## 2016-09-05 NOTE — Assessment & Plan Note (Signed)
Pt's PE WNL w/ exception of obesity.  Overdue for colonoscopy- referral placed.  UTD on Tdap.  Check labs.  Anticipatory guidance provided.

## 2016-09-05 NOTE — Assessment & Plan Note (Signed)
Chronic problem.  Tolerating statin w/o difficulty.  Check labs.  Adjust meds prn  

## 2016-09-05 NOTE — Progress Notes (Signed)
Pre visit review using our clinic review tool, if applicable. No additional management support is needed unless otherwise documented below in the visit note. 

## 2016-09-05 NOTE — Patient Instructions (Signed)
Follow up in 6 months to recheck cholesterol We'll notify you of your lab results and make any changes if needed Continue to work on healthy diet and regular exercise- you can do it! We'll call you with your GI appt for the repeat colonoscopy Call with any questions or concerns Have a great summer!!!

## 2016-10-08 ENCOUNTER — Encounter: Payer: Self-pay | Admitting: Family Medicine

## 2017-01-20 ENCOUNTER — Encounter: Payer: Self-pay | Admitting: Interventional Cardiology

## 2017-01-20 ENCOUNTER — Ambulatory Visit (INDEPENDENT_AMBULATORY_CARE_PROVIDER_SITE_OTHER): Payer: BLUE CROSS/BLUE SHIELD | Admitting: Interventional Cardiology

## 2017-01-20 VITALS — BP 126/88 | HR 62 | Ht 70.0 in | Wt 238.8 lb

## 2017-01-20 DIAGNOSIS — I2510 Atherosclerotic heart disease of native coronary artery without angina pectoris: Secondary | ICD-10-CM | POA: Diagnosis not present

## 2017-01-20 DIAGNOSIS — E782 Mixed hyperlipidemia: Secondary | ICD-10-CM

## 2017-01-20 DIAGNOSIS — R9431 Abnormal electrocardiogram [ECG] [EKG]: Secondary | ICD-10-CM

## 2017-01-20 NOTE — Progress Notes (Signed)
Cardiology Office Note   Date:  01/20/2017   ID:  Eric Rosales, DOB 01/03/1956, MRN [id number]  PCP:  Midge Minium, MD    No chief complaint on file.  CAD  Wt Readings from Last 3 Encounters:  01/20/17 238 lb 12.8 oz (108.3 kg)  09/05/16 232 lb 8 oz (105.5 kg)  12/31/15 235 lb (106.6 kg)       History of Present Illness: Eric Rosales is a 61 y.o. male   Who underwent a coronary intervention in 2012 with a drug-eluting stent to the proximal dominant LAD. Plavix resistant so Effient was used.  Procedure done by Dr. Lia Foyer.  Angina was present when he started walking the treadmill.  His angina was exercise induced nausea, relieved with rest.  THis prompted the cath.  He had a 3.5 x 20 Promus Element stent to the prox LAD.   Denies : Chest pain. Dizziness. Leg edema. Nitroglycerin use. Orthopnea. Palpitations. Paroxysmal nocturnal dyspnea. Shortness of breath. Syncope.   He does exercise some, but not as much as he was before.  No sx like what he had prior to the stent.   No bleeding problems.    Past Medical History:  Diagnosis Date  . CAD (coronary artery disease)    January, 2012, DES proximal dominant LAD at the diagonal takeoff ( P2Y12-no inhibition by Plavix) Effient used  . Easy bruisability    October, 2012  . Hyperlipidemia   . Weight gain    October, 2012    Past Surgical History:  Procedure Laterality Date  . CORONARY STENT PLACEMENT  2012     Current Outpatient Prescriptions  Medication Sig Dispense Refill  . Ascorbic Acid (VITAMIN C) 500 MG tablet Take 500 mg by mouth daily.      Marland Kitchen aspirin 81 MG tablet Take 1 tablet (81 mg total) by mouth daily.    Marland Kitchen atorvastatin (LIPITOR) 40 MG tablet Take 1 tablet (40 mg total) by mouth daily. 90 tablet 3  . CIALIS 20 MG tablet TAKE 1/2-1 TABLET BY MOUTH EVERY OTHER DAY AS NEEDED 6 tablet 2  . Multiple Vitamin (MULTIVITAMIN) capsule Take 1 capsule by mouth daily.      . nitroGLYCERIN (NITROSTAT) 0.4 MG SL  tablet Place 0.4 mg under the tongue every 5 (five) minutes as needed for chest pain. 3 DOSES MAX     No current facility-administered medications for this visit.     Allergies:   Patient has no known allergies.    Social History:  The patient  reports that he has never smoked. He has never used smokeless tobacco. He reports that he drinks about 0.6 oz of alcohol per week . He reports that he does not use drugs.   Family History:  The patient's family history includes Diabetes in his brother, mother, and sister; Hypertension in his unknown relative; Stroke in his mother.    ROS:  Please see the history of present illness.   Otherwise, review of systems are positive for stress at work.   All other systems are reviewed and negative.    PHYSICAL EXAM: VS:  BP 126/88   Pulse 62   Ht 5\' 10"  (1.778 m)   Wt 238 lb 12.8 oz (108.3 kg)   SpO2 99%   BMI 34.26 kg/m  , BMI Body mass index is 34.26 kg/m. GEN: Well nourished, well developed, in no acute distress  HEENT: normal  Neck: no JVD, carotid bruits, or masses Cardiac: RRR; no murmurs,  rubs, or gallops,no edema  Respiratory:  clear to auscultation bilaterally, normal work of breathing GI: soft, nontender, nondistended, + BS, small umbilical hernia MS: no deformity or atrophy  Skin: warm and dry, no rash Neuro:  Strength and sensation are intact Psych: euthymic mood, full affect   EKG:   The ekg ordered today demonstrates NSR, anterior T wave inversions, more pronounced from July 27, 2015, but unchanged from 27-Jul-2014   Recent Labs: 09/05/2016: ALT 40; BUN 12; Creatinine, Ser 1.22; Hemoglobin 14.2; Platelets 193.0; Potassium 4.1; Sodium 140; TSH 1.28   Lipid Panel    Component Value Date/Time   CHOL 131 09/05/2016 1409   TRIG 128.0 09/05/2016 1409   HDL 36.80 (L) 09/05/2016 1409   CHOLHDL 4 09/05/2016 1409   VLDL 25.6 09/05/2016 1409   LDLCALC 69 09/05/2016 1409   LDLDIRECT 139.5 03/19/2012 1059     Other studies Reviewed: Additional  studies/ records that were reviewed today with results demonstrating: `DES to the LAD in July 27, 2010.  Cath report reviewed.   ASSESSMENT AND PLAN:  1. CAD: No angina on medical therapy.  He still does strenuous exertion.  Continue aggressive secondary prevention.   We discussed stress testing, but no sx of angina at this time.  Will hold off for now.  Continue to be active.  2. Hyperlipidemia: Continue atorvastatin 40 mg daily.  LDL 69 in 6/18.   3. Abnormal ECG: Known anterior T wave changes in the past.  He carries a copy of his ECG to document this in case of emergency.    Current medicines are reviewed at length with the patient today.  The patient concerns regarding his medicines were addressed.  The following changes have been made:  No change  Labs/ tests ordered today include:  No orders of the defined types were placed in this encounter.   Recommend 150 minutes/week of aerobic exercise Low fat, low carb, high fiber diet recommended  Disposition:   FU in 1 year   Signed, Larae Grooms, MD  01/20/2017 3:55 PM    [facility] [place], [place],   [address] Phone: [phone]; Fax: [phone]

## 2017-01-20 NOTE — Patient Instructions (Signed)

## 2017-01-24 ENCOUNTER — Other Ambulatory Visit: Payer: Self-pay | Admitting: Interventional Cardiology

## 2017-02-25 ENCOUNTER — Encounter: Payer: Self-pay | Admitting: Family Medicine

## 2017-04-13 ENCOUNTER — Other Ambulatory Visit: Payer: Self-pay | Admitting: General Practice

## 2017-04-13 ENCOUNTER — Encounter: Payer: Self-pay | Admitting: Family Medicine

## 2017-04-13 ENCOUNTER — Other Ambulatory Visit: Payer: Self-pay

## 2017-04-13 ENCOUNTER — Ambulatory Visit (INDEPENDENT_AMBULATORY_CARE_PROVIDER_SITE_OTHER): Payer: BLUE CROSS/BLUE SHIELD | Admitting: Family Medicine

## 2017-04-13 ENCOUNTER — Telehealth: Payer: Self-pay | Admitting: General Practice

## 2017-04-13 VITALS — BP 143/87 | HR 67 | Resp 16 | Ht 70.0 in | Wt 241.2 lb

## 2017-04-13 DIAGNOSIS — R42 Dizziness and giddiness: Secondary | ICD-10-CM | POA: Diagnosis not present

## 2017-04-13 DIAGNOSIS — R030 Elevated blood-pressure reading, without diagnosis of hypertension: Secondary | ICD-10-CM | POA: Diagnosis not present

## 2017-04-13 MED ORDER — FLUTICASONE PROPIONATE 50 MCG/ACT NA SUSP: 2.0000 | Freq: Every day | NASAL | 6 refills | Status: AC

## 2017-04-13 MED ORDER — MECLIZINE HCL 32 MG PO TABS: 32.0000 mg | ORAL_TABLET | Freq: Three times a day (TID) | ORAL | 0 refills | Status: DC | PRN

## 2017-04-13 MED ORDER — MECLIZINE HCL 25 MG PO TABS: 25.0000 mg | ORAL_TABLET | Freq: Three times a day (TID) | ORAL | 0 refills | Status: DC | PRN

## 2017-04-13 NOTE — Telephone Encounter (Signed)
Medication filled to pharmacy as requested.   

## 2017-04-13 NOTE — Progress Notes (Signed)
   Subjective:    Patient ID: Eric Rosales, male    DOB: 1955-09-16, 62 y.o.   MRN: [id number]  HPI Elevated BP- pt reports he was rushing at work on Friday when he felt 'flushed, nauseous'.  Pt had similar feeling 20 yrs ago when he had inner ear infxn.  Had mild dizziness.  Was unable to dry home due to nausea.  Felt better after lying down, ate dinner w/o difficulty.  After dinner went to lie down 'and the room was spinning'.  Checked BP in AM after sleeping all night and 'it was a little elevated', 154/90, 152/103.  Was able to walk on treadmill (after which BP was 130/81) and go about weekend w/o difficulty.  Continues to have mild sense of 'lightheadedness'.     Review of Systems For ROS see HPI     Objective:   Physical Exam  Constitutional: He is oriented to person, place, and time. He appears well-developed and well-nourished. No distress.  HENT:  Head: Normocephalic and atraumatic.  Right Ear: Tympanic membrane and external ear normal.  Left Ear: External ear and ear canal normal. Tympanic membrane is not erythematous, not retracted and not bulging. A middle ear effusion is present.  Nose: Nose normal. Right sinus exhibits no maxillary sinus tenderness and no frontal sinus tenderness. Left sinus exhibits no maxillary sinus tenderness and no frontal sinus tenderness.  Mouth/Throat: Oropharynx is clear and moist. No oropharyngeal exudate.  Eyes: Conjunctivae and EOM are normal. Pupils are equal, round, and reactive to light.  2-3 beats of horizontal nystagmus when looking L  Neck: Normal range of motion. Neck supple. No thyromegaly present.  Cardiovascular: Normal rate, regular rhythm, normal heart sounds and intact distal pulses.  Pulmonary/Chest: Effort normal and breath sounds normal. No respiratory distress. He has no wheezes. He has no rales.  Lymphadenopathy:    He has no cervical adenopathy.  Neurological: He is alert and oriented to person, place, and time. He has normal  reflexes. No cranial nerve deficit. Coordination normal.  Skin: Skin is warm and dry.  Psychiatric: He has a normal mood and affect. His behavior is normal.  Vitals reviewed.         Assessment & Plan:  Vertigo- new.  Suspect this is due to sinus congestion- as evidenced by fluid in L middle ear.  +2-3 beats of horizontal nystagmus when looking L.  Start Meclizine.  Add nasal steroid to improve congestion.  Reviewed supportive care and red flags that should prompt return.  Pt expressed understanding and is in agreement w/ plan.   Elevated BP- pt has hx of this previously but BP has been recently well controlled.  BP has been elevated since Saturday but it is unclear if this is a result of his vertigo and not feeling well.  Pt to return in 3-4 weeks to repeat BP and determine if medication is needed.  Reviewed supportive care and red flags that should prompt return.  Pt expressed understanding and is in agreement w/ plan.

## 2017-04-13 NOTE — Patient Instructions (Signed)
Follow up in 3-4 weeks to recheck BP Start the Flonase- 2 sprays each nostril daily Use the Meclizine as needed for dizziness Drink plenty of fluids Change positions slowly and allow yourself time to adjust Call with any questions or concerns- particularly if symptoms change or worsen Hang in there!!!

## 2017-04-13 NOTE — Telephone Encounter (Signed)
Please advise, received a fax form [facility] advising that the meclizine 32mg  is not available. They only carry 25mg  or 12.5mg . Please advise on the med change.    I already removed the 32mg  from his med list.

## 2017-04-13 NOTE — Telephone Encounter (Signed)
[place] for 25mg  TID PRN, #[phone], no refills

## 2017-05-06 ENCOUNTER — Other Ambulatory Visit: Payer: Self-pay

## 2017-05-06 ENCOUNTER — Ambulatory Visit (INDEPENDENT_AMBULATORY_CARE_PROVIDER_SITE_OTHER): Payer: BLUE CROSS/BLUE SHIELD | Admitting: Family Medicine

## 2017-05-06 ENCOUNTER — Encounter: Payer: Self-pay | Admitting: Family Medicine

## 2017-05-06 VITALS — BP 131/84 | HR 72 | Temp 98.7°F | Resp 16 | Ht 70.0 in | Wt 241.5 lb

## 2017-05-06 DIAGNOSIS — R030 Elevated blood-pressure reading, without diagnosis of hypertension: Secondary | ICD-10-CM

## 2017-05-06 NOTE — Patient Instructions (Signed)
Schedule your complete physical for after 6/18 Keep up the good work!  You look great!!! Call with any questions or concerns Happy Valentine's Day!!

## 2017-05-06 NOTE — Progress Notes (Signed)
   Subjective:    Patient ID: Eric Rosales, male    DOB: April 11, 1955, 62 y.o.   MRN: [id number]  HPI Elevated BP- BP was elevated at last visit but pt was suffering from Vertigo at the time.  Dizziness has resolved (~1 week after appt) and BP has also normalized.  No CP, SOB, HAs, visual changes, edema.   Review of Systems For ROS see HPI     Objective:   Physical Exam  Constitutional: He is oriented to person, place, and time. He appears well-developed and well-nourished. No distress.  HENT:  Head: Normocephalic and atraumatic.  Eyes: Conjunctivae and EOM are normal. Pupils are equal, round, and reactive to light.  Neck: Normal range of motion. Neck supple. No thyromegaly present.  Cardiovascular: Normal rate, regular rhythm, normal heart sounds and intact distal pulses.  No murmur heard. Pulmonary/Chest: Effort normal and breath sounds normal. No respiratory distress.  Abdominal: Soft. Bowel sounds are normal. He exhibits no distension.  Musculoskeletal: He exhibits no edema.  Lymphadenopathy:    He has no cervical adenopathy.  Neurological: He is alert and oriented to person, place, and time. No cranial nerve deficit.  Skin: Skin is warm and dry.  Psychiatric: He has a normal mood and affect. His behavior is normal.  Vitals reviewed.         Assessment & Plan:  Elevated BP- this has improved since pt's vertigo has resolved.  No further work up at this time.  Will follow.

## 2017-09-15 ENCOUNTER — Encounter: Payer: Self-pay | Admitting: Family Medicine

## 2017-09-18 ENCOUNTER — Encounter: Payer: Self-pay | Admitting: Family Medicine

## 2017-09-22 ENCOUNTER — Other Ambulatory Visit: Payer: Self-pay

## 2017-09-22 ENCOUNTER — Encounter: Payer: Self-pay | Admitting: Family Medicine

## 2017-09-22 ENCOUNTER — Ambulatory Visit (INDEPENDENT_AMBULATORY_CARE_PROVIDER_SITE_OTHER): Payer: Managed Care, Other (non HMO) | Admitting: Family Medicine

## 2017-09-22 VITALS — BP 121/86 | HR 84 | Temp 98.1°F | Resp 16 | Ht 70.0 in | Wt 240.1 lb

## 2017-09-22 DIAGNOSIS — Z0000 Encounter for general adult medical examination without abnormal findings: Secondary | ICD-10-CM

## 2017-09-22 DIAGNOSIS — E782 Mixed hyperlipidemia: Secondary | ICD-10-CM | POA: Diagnosis not present

## 2017-09-22 DIAGNOSIS — Z1211 Encounter for screening for malignant neoplasm of colon: Secondary | ICD-10-CM

## 2017-09-22 DIAGNOSIS — Z125 Encounter for screening for malignant neoplasm of prostate: Secondary | ICD-10-CM

## 2017-09-22 LAB — CBC WITH DIFFERENTIAL/PLATELET
BASOS PCT: 0.5 % (ref 0.0–3.0)
Basophils Absolute: 0 10*3/uL (ref 0.0–0.1)
Eosinophils Absolute: 0.1 10*3/uL (ref 0.0–0.7)
Eosinophils Relative: 1.9 % (ref 0.0–5.0)
HEMATOCRIT: 41.9 % (ref 39.0–52.0)
Hemoglobin: 14.3 g/dL (ref 13.0–17.0)
LYMPHS ABS: 1.6 10*3/uL (ref 0.7–4.0)
LYMPHS PCT: 28.8 % (ref 12.0–46.0)
MCHC: 34 g/dL (ref 30.0–36.0)
MCV: 91.4 fl (ref 78.0–100.0)
MONOS PCT: 10.6 % (ref 3.0–12.0)
Monocytes Absolute: 0.6 10*3/uL (ref 0.1–1.0)
NEUTROS PCT: 58.2 % (ref 43.0–77.0)
Neutro Abs: 3.2 10*3/uL (ref 1.4–7.7)
Platelets: 164 10*3/uL (ref 150.0–400.0)
RBC: 4.59 Mil/uL (ref 4.22–5.81)
RDW: 15.5 % (ref 11.5–15.5)
WBC: 5.6 10*3/uL (ref 4.0–10.5)

## 2017-09-22 LAB — LIPID PANEL
CHOLESTEROL: 163 mg/dL (ref 0–200)
HDL: 47.8 mg/dL (ref >39.00)
LDL CALC: 99 mg/dL (ref 0–99)
NONHDL: 115.5
Total CHOL/HDL Ratio: 3
Triglycerides: 83 mg/dL (ref 0.0–149.0)
VLDL: 16.6 mg/dL (ref 0.0–40.0)

## 2017-09-22 LAB — BASIC METABOLIC PANEL
BUN: 12 mg/dL (ref 6–23)
CALCIUM: 9.6 mg/dL (ref 8.4–10.5)
CO2: 28 meq/L (ref 19–32)
Chloride: 104 mEq/L (ref 96–112)
Creatinine, Ser: 1.22 mg/dL (ref 0.40–1.50)
GFR: 77.46 mL/min (ref >60.00)
Glucose, Bld: 106 mg/dL — ABNORMAL HIGH (ref 70–99)
POTASSIUM: 3.8 meq/L (ref 3.5–5.1)
SODIUM: 140 meq/L (ref 135–145)

## 2017-09-22 LAB — HEPATIC FUNCTION PANEL
ALK PHOS: 97 U/L (ref 39–117)
ALT: 44 U/L (ref 0–53)
AST: 24 U/L (ref 0–37)
Albumin: 4.5 g/dL (ref 3.5–5.2)
BILIRUBIN DIRECT: 0.2 mg/dL (ref 0.0–0.3)
BILIRUBIN TOTAL: 1 mg/dL (ref 0.2–1.2)
Total Protein: 7.7 g/dL (ref 6.0–8.3)

## 2017-09-22 LAB — TSH: TSH: 1.44 u[IU]/mL (ref 0.35–4.50)

## 2017-09-22 LAB — PSA: PSA: 0.54 ng/mL (ref 0.10–4.00)

## 2017-09-22 MED ORDER — NITROGLYCERIN 0.4 MG SL SUBL: 0.4000 mg | SUBLINGUAL_TABLET | SUBLINGUAL | 1 refills | Status: AC | PRN

## 2017-09-22 NOTE — Progress Notes (Signed)
   Subjective:    Patient ID: Eric Rosales, male    DOB: March 18, 1956, 62 y.o.   MRN: [id number]  HPI CPE- UTD on Tdap.  Due for colonoscopy (Dr [name])  No concerns today.   Review of Systems Patient reports no vision/hearing changes, anorexia, fever ,adenopathy, persistant/recurrent hoarseness, swallowing issues, chest pain, palpitations, edema, persistant/recurrent cough, hemoptysis, dyspnea (rest,exertional, paroxysmal nocturnal), gastrointestinal  bleeding (melena, rectal bleeding), abdominal pain, excessive heart burn, GU symptoms (dysuria, hematuria, voiding/incontinence issues) syncope, focal weakness, memory loss, numbness & tingling, skin/hair/nail changes, depression, anxiety, abnormal bruising/bleeding, musculoskeletal symptoms/signs.     Objective:   Physical Exam BP 121/86   Pulse 84   Temp 98.1 F (36.7 C) (Oral)   Resp 16   Ht 5\' 10"  (1.778 m)   Wt 240 lb 2 oz (108.9 kg)   SpO2 98%   BMI 34.45 kg/m   General Appearance:    Alert, cooperative, no distress, appears stated age  Head:    Normocephalic, without obvious abnormality, atraumatic  Eyes:    PERRL, conjunctiva/corneas clear, EOM's intact, fundi    benign, both eyes       Ears:    Normal TM's and external ear canals, both ears  Nose:   Nares normal, septum midline, mucosa normal, no drainage   or sinus tenderness  Throat:   Lips, mucosa, and tongue normal; teeth and gums normal  Neck:   Supple, symmetrical, trachea midline, no adenopathy;       thyroid:  No enlargement/tenderness/nodules  Back:     Symmetric, no curvature, ROM normal, no CVA tenderness  Lungs:     Clear to auscultation bilaterally, respirations unlabored  Chest wall:    No tenderness or deformity  Heart:    Regular rate and rhythm, S1 and S2 normal, no murmur, rub   or gallop  Abdomen:     Soft, non-tender, bowel sounds active all four quadrants,    no masses, no organomegaly  Genitalia:    Normal male without lesion, discharge or tenderness    Rectal:    Normal tone, normal prostate, no masses or tenderness  Extremities:   Extremities normal, atraumatic, no cyanosis or edema  Pulses:   2+ and symmetric all extremities  Skin:   Skin color, texture, turgor normal, no rashes or lesions  Lymph nodes:   Cervical, supraclavicular, and axillary nodes normal  Neurologic:   CNII-XII intact. Normal strength, sensation and reflexes      throughout          Assessment & Plan:

## 2017-09-22 NOTE — Assessment & Plan Note (Signed)
Chronic problem.  Tolerating statin w/o difficulty.  Encouraged healthy diet and regular exercise.  Check labs.  Adjust meds prn. 

## 2017-09-22 NOTE — Assessment & Plan Note (Signed)
Pt's PE WNL w/ exception of obesity.  UTD on Tdap.  Due for repeat colonoscopy- referral placed.  Check labs.  Anticipatory guidance provided.

## 2017-09-22 NOTE — Patient Instructions (Signed)
Follow up in 6 months to recheck cholesterol We'll notify you of your lab results and make any changes if needed Continue to work on healthy diet and regular exercise- you can do it!! We'll call you with your GI appt for the colonoscopy Call with any questions or concerns Have a great summer!!

## 2017-09-23 ENCOUNTER — Encounter: Payer: Self-pay | Admitting: General Practice

## 2017-11-12 ENCOUNTER — Telehealth: Payer: Self-pay | Admitting: Emergency Medicine

## 2017-11-12 NOTE — Telephone Encounter (Signed)
Please advise 

## 2017-11-12 NOTE — Telephone Encounter (Signed)
Copied from [place] [id number]. Topic: Bill or Statement - Patient/Guarantor Inquiry >> Nov 12, 2017  3:41 PM Sheran Luz wrote: Pt states that his insurance company suggested he contact the office because two of blood test results said illness when the appointment was an annual wellness visit. Pt would like a call back for clarity and advice on how to resolve the issue. Until 5pm: [phone] After 5pm: [phone]

## 2017-11-12 NOTE — Telephone Encounter (Signed)
Called and left a detailed message for pt to advise that I was going to send this to our office manager to look into.   [place] for [facility] to Discuss results / PCP recommendations / Schedule patient.

## 2017-11-12 NOTE — Telephone Encounter (Signed)
We can have our office manager look at this billing issue but insurance companies rarely cover labs as part of the routine physical.  This means we now have to link the labs to a medical dx- in this case, hyperlipidemia.

## 2017-11-13 NOTE — Telephone Encounter (Signed)
Dawn can yo look at this on your return please.

## 2017-11-16 NOTE — Telephone Encounter (Signed)
Hello,   Since the patient has hyperlipidemia, this is correct diagnosis code, for the lipid panel. Dr. Birdie Riddle is checking due to this per her note.    I will have the thyroid test changed to Z00.00, per her A&P under wellness visit to check labs. Thyroid test is not always covered under plans, it will depend on his.    Thanks,  [employer]

## 2017-11-16 NOTE — Telephone Encounter (Signed)
Copied from [place] [id number]. Topic: Bill or Statement - Patient/Guarantor Inquiry >> Nov 12, 2017  3:41 PM Sheran Luz wrote: Pt states that his insurance company suggested he contact the office because two of blood test results said illness when the appointment was an annual wellness visit. Pt would like a call back for clarity and advice on how to resolve the issue. Until 5pm: [phone] After 5pm: [phone] >> Nov 13, 2017  6:51 AM Jerene Dilling H wrote: Please send request to the office. [facility] coding is unable to verify lab billing/coding.   Thank you,

## 2017-11-23 ENCOUNTER — Encounter: Payer: Self-pay | Admitting: Family Medicine

## 2018-02-09 ENCOUNTER — Other Ambulatory Visit: Payer: Self-pay | Admitting: Interventional Cardiology

## 2018-03-08 ENCOUNTER — Other Ambulatory Visit: Payer: Self-pay | Admitting: Interventional Cardiology

## 2018-03-10 ENCOUNTER — Other Ambulatory Visit: Payer: Self-pay | Admitting: Interventional Cardiology

## 2018-04-04 ENCOUNTER — Other Ambulatory Visit: Payer: Self-pay | Admitting: Interventional Cardiology

## 2018-04-29 ENCOUNTER — Other Ambulatory Visit: Payer: Self-pay | Admitting: Interventional Cardiology

## 2018-06-22 ENCOUNTER — Encounter: Payer: Self-pay | Admitting: Interventional Cardiology

## 2018-06-28 ENCOUNTER — Telehealth: Payer: Self-pay

## 2018-06-28 NOTE — Telephone Encounter (Signed)
Called pt to possibly set up evisit. Left voicemail asking him to call the office.

## 2018-06-28 NOTE — Telephone Encounter (Signed)
  Patient is returning, please call back

## 2018-06-28 NOTE — Telephone Encounter (Signed)
Called pt to possibly set up evisit. Pt refused and would rather be rescheduled for a face to face visit.

## 2018-06-30 NOTE — Telephone Encounter (Signed)
Called pt to check sx and reschedule visit for possibel OV. Instructed pt to call me back at the office.

## 2018-06-30 NOTE — Telephone Encounter (Signed)
Left message for patient to call back  

## 2018-07-01 ENCOUNTER — Ambulatory Visit: Payer: Managed Care, Other (non HMO) | Admitting: Interventional Cardiology

## 2018-07-01 ENCOUNTER — Other Ambulatory Visit: Payer: Self-pay

## 2018-07-01 MED ORDER — ATORVASTATIN CALCIUM 40 MG PO TABS: 40.0000 mg | ORAL_TABLET | Freq: Every day | ORAL | 1 refills | Status: DC

## 2018-07-01 NOTE — Telephone Encounter (Signed)
Appointment has been rescheduled.

## 2018-10-20 ENCOUNTER — Ambulatory Visit: Payer: Managed Care, Other (non HMO) | Admitting: Interventional Cardiology

## 2018-10-20 ENCOUNTER — Other Ambulatory Visit: Payer: Self-pay

## 2018-10-20 ENCOUNTER — Encounter: Payer: Self-pay | Admitting: Interventional Cardiology

## 2018-10-20 VITALS — BP 146/92 | HR 67 | Ht 70.0 in | Wt 249.0 lb

## 2018-10-20 DIAGNOSIS — R9431 Abnormal electrocardiogram [ECG] [EKG]: Secondary | ICD-10-CM

## 2018-10-20 DIAGNOSIS — I2510 Atherosclerotic heart disease of native coronary artery without angina pectoris: Secondary | ICD-10-CM | POA: Diagnosis not present

## 2018-10-20 DIAGNOSIS — E782 Mixed hyperlipidemia: Secondary | ICD-10-CM | POA: Diagnosis not present

## 2018-10-20 MED ORDER — ATORVASTATIN CALCIUM 40 MG PO TABS: 40.0000 mg | ORAL_TABLET | Freq: Every day | ORAL | 3 refills | Status: DC

## 2018-10-20 NOTE — Progress Notes (Signed)
Cardiology Office Note   Date:  10/20/2018   ID:  Eric Rosales, DOB Jul 16, 1955, MRN [id number]  PCP:  Midge Minium, MD    No chief complaint on file.  CAD  Wt Readings from Last 3 Encounters:  10/20/18 249 lb (112.9 kg)  09/22/17 240 lb 2 oz (108.9 kg)  05/06/17 241 lb 8 oz (109.5 kg)       History of Present Illness: Eric Rosales is a 63 y.o. male  Who underwent a coronary intervention in 2012 with a drug-eluting stent to the proximal dominant LAD. Plavix resistant so Effient was used.  Procedure done by Dr. Lia Foyer.  Angina was present when he started walking the treadmill.  His angina was exercise induced nausea, relieved with rest. THis prompted the cath. He had a 3.5 x 20 Promus Element stent to the prox LAD.   We discussed stress testing in 2018, but he was feeling well so declined.  SInce the last visit, no problems.  He has not been exercising as much as he would like.  He does walk on the weekends.  He uses a treadmill as well.   Denies : Chest pain. Dizziness. Leg edema. Nitroglycerin use. Orthopnea. Palpitations. Paroxysmal nocturnal dyspnea. Shortness of breath. Syncope.   No sx like prior to his PCI. He has gained weight while more inactive.      Past Medical History:  Diagnosis Date  . CAD (coronary artery disease)    January, 2012, DES proximal dominant LAD at the diagonal takeoff ( P2Y12-no inhibition by Plavix) Effient used  . Easy bruisability    October, 2012  . Hyperlipidemia   . Weight gain    October, 2012    Past Surgical History:  Procedure Laterality Date  . CORONARY STENT PLACEMENT  2012     Current Outpatient Medications  Medication Sig Dispense Refill  . Ascorbic Acid (VITAMIN C) 500 MG tablet Take 500 mg by mouth daily.      Marland Kitchen aspirin 81 MG tablet Take 1 tablet (81 mg total) by mouth daily.    Marland Kitchen atorvastatin (LIPITOR) 40 MG tablet Take 1 tablet (40 mg total) by mouth daily at 6 PM. 90 tablet 1  . CIALIS 20 MG tablet TAKE  1/2-1 TABLET BY MOUTH EVERY OTHER DAY AS NEEDED 6 tablet 2  . fluticasone (FLONASE) 50 MCG/ACT nasal spray Place 2 sprays into both nostrils daily. 16 g 6  . Multiple Vitamin (MULTIVITAMIN) capsule Take 1 capsule by mouth daily.      . nitroGLYCERIN (NITROSTAT) 0.4 MG SL tablet Place 1 tablet (0.4 mg total) under the tongue every 5 (five) minutes as needed for chest pain. 3 DOSES MAX 30 tablet 1  . Turmeric 500 MG CAPS Take 1,000 mg by mouth daily.     No current facility-administered medications for this visit.     Allergies:   Patient has no known allergies.    Social History:  The patient  reports that he has never smoked. He has never used smokeless tobacco. He reports current alcohol use of about 1.0 standard drinks of alcohol per week. He reports that he does not use drugs.   Family History:  The patient's family history includes Diabetes in his brother, mother, and sister; Hypertension in an other family member; Stroke in his mother.    ROS:  Please see the history of present illness.   Otherwise, review of systems are positive for weight gain since COVID 19.   All other  systems are reviewed and negative.    PHYSICAL EXAM: VS:  BP (!) 146/92   Pulse 67   Ht 5\' 10"  (1.778 m)   Wt 249 lb (112.9 kg)   SpO2 95%   BMI 35.73 kg/m  , BMI Body mass index is 35.73 kg/m. GEN: Well nourished, well developed, in no acute distress  HEENT: normal  Neck: no JVD, carotid bruits, or masses Cardiac: RRR; no murmurs, rubs, or gallops,no edema  Respiratory:  clear to auscultation bilaterally, normal work of breathing GI: soft, nontender, nondistended, + BS MS: no deformity or atrophy  Skin: warm and dry, no rash Neuro:  Strength and sensation are intact Psych: euthymic mood, full affect   EKG:   The ekg ordered today demonstrates NSR, ant, inf T wave inversion; no change from prior.   Recent Labs: No results found for requested labs within last 8760 hours.   Lipid Panel     Component Value Date/Time   CHOL 163 09/22/2017 1155   TRIG 83.0 09/22/2017 1155   HDL 47.80 09/22/2017 1155   CHOLHDL 3 09/22/2017 1155   VLDL 16.6 09/22/2017 1155   LDLCALC 99 09/22/2017 1155   LDLDIRECT 139.5 03/19/2012 1059     Other studies Reviewed: Additional studies/ records that were reviewed today with results demonstrating: labs reviewed; cath result reviewed.   ASSESSMENT AND PLAN:  1. CAD: No angina.  Continue aggressive secondary prevention.  Continue regular exercise.  2. Hyperlipidemia: needs lipids rechecked.  Will have this done with his PMD.  3. Abnormal ECG: Known anterior T wave changes.  He carries an ECG with him so that he has an old one for comparison. 4. He is trying to distance and wear a mask.    Current medicines are reviewed at length with the patient today.  The patient concerns regarding his medicines were addressed.  The following changes have been made:  No change  Labs/ tests ordered today include:  No orders of the defined types were placed in this encounter.   Recommend 150 minutes/week of aerobic exercise Low fat, low carb, high fiber diet recommended  Disposition:   FU in 1 year   Signed, Larae Grooms, MD  10/20/2018 3:23 PM    [facility] [address], [place], [place]  [address] Phone: [phone]; Fax: [phone]

## 2018-10-20 NOTE — Patient Instructions (Signed)
Medication Instructions:  No changes If you need a refill on your cardiac medications before your next appointment, please call your pharmacy.   Lab work: none If you have labs (blood work) drawn today and your tests are completely normal, you will receive your results only by: Marland Kitchen MyChart Message (if you have MyChart) OR . A paper copy in the mail If you have any lab test that is abnormal or we need to change your treatment, we will call you to review the results.  Testing/Procedures: none  Follow-Up: At [facility], you and your health needs are our priority.  As part of our continuing mission to provide you with exceptional heart care, we have created designated Provider Care Teams.  These Care Teams include your primary Cardiologist (physician) and Advanced Practice Providers (APPs -  Physician Assistants and Nurse Practitioners) who all work together to provide you with the care you need, when you need it. You will need a follow up appointment in 12 months.  Please call our office 2 months in advance to schedule this appointment.  You may see Larae Grooms, MD or one of the following Advanced Practice Providers on your designated Care Team:   [place], PA-C Melina Copa, PA-C . Ermalinda Barrios, PA-C  Any Other Special Instructions Will Be Listed Below (If Applicable).

## 2019-08-01 ENCOUNTER — Ambulatory Visit: Payer: Managed Care, Other (non HMO) | Attending: Internal Medicine

## 2019-08-01 DIAGNOSIS — Z23 Encounter for immunization: Secondary | ICD-10-CM

## 2019-08-01 NOTE — Progress Notes (Signed)
   Covid-19 Vaccination Clinic  Name:  Eric Rosales    MRN: [id number] DOB: November 14, 1955  08/01/2019  Eric Rosales was observed post Covid-19 immunization for 15 minutes without incident. He was provided with Vaccine Information Sheet and instruction to access the V-Safe system.   Eric Rosales was instructed to call 911 with any severe reactions post vaccine: Marland Kitchen Difficulty breathing  . Swelling of face and throat  . A fast heartbeat  . A bad rash all over body  . Dizziness and weakness   Immunizations Administered    Name Date Dose VIS Date Route   Moderna COVID-19 Vaccine 08/01/2019 10:01 AM 0.5 mL 03/2019 Intramuscular   Manufacturer: Moderna   LotFP:[id number]   AthelstanPO:[id number]

## 2019-09-05 ENCOUNTER — Ambulatory Visit: Payer: Managed Care, Other (non HMO) | Attending: Internal Medicine

## 2019-09-05 DIAGNOSIS — Z23 Encounter for immunization: Secondary | ICD-10-CM

## 2019-09-05 NOTE — Progress Notes (Signed)
   Covid-19 Vaccination Clinic  Name:  Eric Rosales    MRN: [id number] DOB: 1955/09/21  09/05/2019  Eric Rosales was observed post Covid-19 immunization for 15 minutes without incident. He was provided with Vaccine Information Sheet and instruction to access the V-Safe system.   Eric Rosales was instructed to call 911 with any severe reactions post vaccine: Marland Kitchen Difficulty breathing  . Swelling of face and throat  . A fast heartbeat  . A bad rash all over body  . Dizziness and weakness   Immunizations Administered    Name Date Dose VIS Date Route   Moderna COVID-19 Vaccine 09/05/2019  9:57 AM 0.5 mL 03/2019 Intramuscular   Manufacturer: Moderna   Lot: [id number]   [place]: [id number]

## 2019-10-21 ENCOUNTER — Other Ambulatory Visit: Payer: Self-pay | Admitting: Interventional Cardiology

## 2019-11-16 ENCOUNTER — Other Ambulatory Visit: Payer: Self-pay | Admitting: Interventional Cardiology

## 2019-11-23 ENCOUNTER — Encounter: Payer: Self-pay | Admitting: Family Medicine

## 2019-11-23 NOTE — Telephone Encounter (Signed)
Pt is scheduled for an appt on 11/25/2019

## 2019-11-25 ENCOUNTER — Other Ambulatory Visit: Payer: Self-pay | Admitting: Interventional Cardiology

## 2019-11-25 ENCOUNTER — Other Ambulatory Visit: Payer: Self-pay

## 2019-11-25 ENCOUNTER — Ambulatory Visit: Payer: Managed Care, Other (non HMO) | Admitting: Family Medicine

## 2019-11-25 ENCOUNTER — Encounter: Payer: Self-pay | Admitting: Family Medicine

## 2019-11-25 VITALS — BP 142/92 | HR 90 | Temp 97.9°F | Resp 16 | Ht 70.0 in | Wt 250.1 lb

## 2019-11-25 DIAGNOSIS — R030 Elevated blood-pressure reading, without diagnosis of hypertension: Secondary | ICD-10-CM

## 2019-11-25 DIAGNOSIS — R42 Dizziness and giddiness: Secondary | ICD-10-CM

## 2019-11-25 DIAGNOSIS — H6981 Other specified disorders of Eustachian tube, right ear: Secondary | ICD-10-CM

## 2019-11-25 MED ORDER — MECLIZINE HCL 25 MG PO TABS
25.0000 mg | ORAL_TABLET | Freq: Three times a day (TID) | ORAL | 0 refills | Status: DC | PRN
Start: 2019-11-25 — End: 2022-10-27

## 2019-11-25 NOTE — Progress Notes (Signed)
Subjective:    Patient ID: Eric Rosales, male    DOB: 07-19-1955, 64 y.o.   MRN: [id number]  HPI ER f/u- pt went to ER on 8/19 complaining of vertigo (room spinning).  Was at work when he 'was rushing to get things done' when he got 'real hot and sweaty'.  Started vomiting.  BP was elevated.  Had normal head CT, unremarkable EKG, and essentially normal labs.  At this time, the room is no longer spinning.  Pt reports difficulty sitting at computer or attempting to read- this improves with lying down.  Has not had eye exam in at least 3 yrs.  Walking is mostly normal.  No CP, SOB.  He has been under considerable stress recently.   Review of Systems For ROS see HPI   This visit occurred during the SARS-CoV-2 public health emergency.  Safety protocols were in place, including screening questions prior to the visit, additional usage of staff PPE, and extensive cleaning of exam room while observing appropriate contact time as indicated for disinfecting solutions.       Objective:   Physical Exam Vitals reviewed.  Constitutional:      General: He is not in acute distress.    Appearance: Normal appearance. He is well-developed.  HENT:     Head: Normocephalic and atraumatic.     Right Ear: Ear canal normal.     Left Ear: Tympanic membrane and ear canal normal.     Ears:     Comments: R TM retracted Eyes:     Extraocular Movements: Extraocular movements intact.     Conjunctiva/sclera: Conjunctivae normal.     Pupils: Pupils are equal, round, and reactive to light.     Comments: 3-4 beats of horizontal nystagmus when looking R No nystagmus when looking L  Neck:     Thyroid: No thyromegaly.  Cardiovascular:     Rate and Rhythm: Normal rate and regular rhythm.     Heart sounds: Normal heart sounds. No murmur heard.   Pulmonary:     Effort: Pulmonary effort is normal. No respiratory distress.     Breath sounds: Normal breath sounds.  Abdominal:     General: Bowel sounds are normal. There  is no distension.     Palpations: Abdomen is soft.  Musculoskeletal:     Cervical back: Normal range of motion and neck supple.  Lymphadenopathy:     Cervical: No cervical adenopathy.  Skin:    General: Skin is warm and dry.  Neurological:     Mental Status: He is alert and oriented to person, place, and time.     Cranial Nerves: No cranial nerve deficit.     Gait: Gait normal.     Deep Tendon Reflexes: Reflexes normal.  Psychiatric:        Behavior: Behavior normal.           Assessment & Plan:  Vertigo- new.  Pt had severe episode of vertigo w/ vomiting on 8/19 that required ER evaluation.  He reports feeling better since then but still has occasional mild dizziness.  Reviewed possible causes- dehydration, sinus congestion, elevated BP, idiopathic, intracranial process.  Head CT WNL.  Pt has evidence of eustachian tube dysfxn on PE.  Encouraged pt to start Flonase and Zyrtec daily.  He is also to manage stress, schedule eye exam, and increase fluid intake.  Change positions slowly.  Meclizine PRN.  If no improvement in 1 week, will refer to neuro.  Pt expressed understanding and  is in agreement w/ plan.   Eustachian tube dysfxn- see above.  Start daily antihistamine and nasal steroid  Elevated BP- pt's BP is elevated but he doesn't have hx of HTN.  Discussed stress management, low salt diet, increased physical activity.  Will monitor at upcoming appts and if lifestyle modifications don't improve BP will need to discuss medication.

## 2019-11-25 NOTE — Patient Instructions (Signed)
Schedule your complete physical at your convenience START the Claritin and Flonase daily (both are available OTC and generic is just as good) Drink plenty of fluids Change positions slowly to allow yourself time to adjust Use the Meclizine as needed for dizziness Limit your salt intake Try and work on stress management Schedule an eye exam Call with any questions or concerns Stay Safe!  Stay Healthy!

## 2019-12-03 ENCOUNTER — Other Ambulatory Visit: Payer: Self-pay | Admitting: Interventional Cardiology

## 2019-12-21 ENCOUNTER — Other Ambulatory Visit: Payer: Self-pay | Admitting: Interventional Cardiology

## 2020-01-17 NOTE — Progress Notes (Signed)
Cardiology Office Note   Date:  01/18/2020   ID:  Eric Rosales, DOB 06/23/1955, MRN [id number]  PCP:  Midge Minium, MD    No chief complaint on file.  CAD  Wt Readings from Last 3 Encounters:  01/18/20 259 lb 3.2 oz (117.6 kg)  11/25/19 250 lb 2 oz (113.5 kg)  10/20/18 249 lb (112.9 kg)       History of Present Illness: Eric Rosales is a 64 y.o. male  Who underwent a coronary intervention in 2012 with a drug-eluting stent to the proximal dominant LAD. Plavix resistant so Effient was used.Procedure done by Dr. Lia Foyer.Angina was present when he started walking the treadmill.  His angina was exercise induced nausea, relieved with rest. THis prompted the cath. He had a 3.5 x 20 Promus Element stent to the prox LAD.  We discussed stress testing in 2018, but he was feeling well so declined.  SInce the last visit, no problems.  He has not been exercising as much as he would like.  He does walk on the weekends.  He uses a treadmill as well.   No sx like he had in 2012.  Denies : Chest pain. Dizziness. Leg edema. Nitroglycerin use. Orthopnea. Palpitations. Paroxysmal nocturnal dyspnea. Shortness of breath. Syncope.   Had some vertigo but this is improving.   He got his COVID shots.    He works as an Chief Financial Officer at [employer].  Less activity.    Home BP readings are in the 120/80 range.  He has been checking more recently due to vertigo episode. HR is now in the 70s compared to low 60s in the past.      Past Medical History:  Diagnosis Date  . CAD (coronary artery disease)    January, 2012, DES proximal dominant LAD at the diagonal takeoff ( P2Y12-no inhibition by Plavix) Effient used  . Easy bruisability    October, 2012  . Hyperlipidemia   . Weight gain    October, 2012    Past Surgical History:  Procedure Laterality Date  . CORONARY STENT PLACEMENT  2012     Current Outpatient Medications  Medication Sig Dispense Refill  . Ascorbic Acid (VITAMIN C)  500 MG tablet Take 500 mg by mouth daily.      Marland Kitchen aspirin 81 MG tablet Take 1 tablet (81 mg total) by mouth daily.    Marland Kitchen atorvastatin (LIPITOR) 40 MG tablet TAKE 1 TABLET BY MOUTH DAILY AT 6 PM. PLEASE KEEP UPCOMING APPT IN OCTOBER BEFORE ANYMORE REFILLS. 90 tablet 0  . CIALIS 20 MG tablet TAKE 1/2-1 TABLET BY MOUTH EVERY OTHER DAY AS NEEDED 6 tablet 2  . fluticasone (FLONASE) 50 MCG/ACT nasal spray Place 2 sprays into both nostrils daily. 16 g 6  . meclizine (ANTIVERT) 25 MG tablet Take 1 tablet (25 mg total) by mouth 3 (three) times daily as needed. 45 tablet 0  . Multiple Vitamin (MULTIVITAMIN) capsule Take 1 capsule by mouth daily.      . nitroGLYCERIN (NITROSTAT) 0.4 MG SL tablet Place 1 tablet (0.4 mg total) under the tongue every 5 (five) minutes as needed for chest pain. 3 DOSES MAX 30 tablet 1  . Turmeric 500 MG CAPS Take 1,000 mg by mouth daily.     No current facility-administered medications for this visit.    Allergies:   Patient has no known allergies.    Social History:  The patient  reports that he has never smoked. He has never used  smokeless tobacco. He reports current alcohol use of about 1.0 standard drink of alcohol per week. He reports that he does not use drugs.   Family History:  The patient's family history includes Diabetes in his brother, mother, and sister; Hypertension in an other family member; Stroke in his mother.    ROS:  Please see the history of present illness.   Otherwise, review of systems are positive for mild vertigo.   All other systems are reviewed and negative.    PHYSICAL EXAM: VS:  BP (!) 148/80   Pulse 71   Ht 5\' 10"  (1.778 m)   Wt 259 lb 3.2 oz (117.6 kg)   SpO2 99%   BMI 37.19 kg/m  , BMI Body mass index is 37.19 kg/m. GEN: Well nourished, well developed, in no acute distress  HEENT: normal  Neck: no JVD, carotid bruits, or masses Cardiac: RRR; no murmurs, rubs, or gallops,no edema  Respiratory:  clear to auscultation bilaterally,  normal work of breathing GI: soft, nontender, nondistended, + BS MS: no deformity or atrophy  Skin: warm and dry, no rash Neuro:  Strength and sensation are intact Psych: euthymic mood, full affect   EKG:   The ekg ordered today demonstrates NSR, diffuse TWI- unchanged   Recent Labs: No results found for requested labs within last 8760 hours.   Lipid Panel    Component Value Date/Time   CHOL 163 09/22/2017 1155   TRIG 83.0 09/22/2017 1155   HDL 47.80 09/22/2017 1155   CHOLHDL 3 09/22/2017 1155   VLDL 16.6 09/22/2017 1155   LDLCALC 99 09/22/2017 1155   LDLDIRECT 139.5 03/19/2012 1059     Other studies Reviewed: Additional studies/ records that were reviewed today with results demonstrating: 11/17/19 labs reviewed; Cr 1.1; . ALT 67- slightly high   ASSESSMENT AND PLAN:  1. CAD: No angina.  Continue aspirin.  Plavix nonresponder.  Took Effient in the past.  Continue aggressive secondary prevention.  Increase exercise. 2. Hyperlipidemia: Needs a repeat lipid panel.  Will see Dr. Birdie Riddle for this.  Whole food plant based diet recommended.  High fiber diet recommended. 3. Abnormal ECG: Prior anterior T wave changes.  No change today. 4. Elevated LFT: noted in 8/21.  Recheck with Dr. Birdie Riddle. Continue statin for now.    Current medicines are reviewed at length with the patient today.  The patient concerns regarding his medicines were addressed.  The following changes have been made:  No change  Labs/ tests ordered today include:  No orders of the defined types were placed in this encounter.   Recommend 150 minutes/week of aerobic exercise Low fat, low carb, high fiber diet recommended  Disposition:   FU in 1 year   Signed, Larae Grooms, MD  01/18/2020 3:38 PM    [facility] [place], [place], [place]  [address] Phone: [phone]; Fax: [phone]

## 2020-01-18 ENCOUNTER — Other Ambulatory Visit: Payer: Self-pay

## 2020-01-18 ENCOUNTER — Ambulatory Visit: Payer: Managed Care, Other (non HMO) | Admitting: Interventional Cardiology

## 2020-01-18 ENCOUNTER — Encounter: Payer: Self-pay | Admitting: Interventional Cardiology

## 2020-01-18 VITALS — BP 148/80 | HR 71 | Ht 70.0 in | Wt 259.2 lb

## 2020-01-18 DIAGNOSIS — R9431 Abnormal electrocardiogram [ECG] [EKG]: Secondary | ICD-10-CM

## 2020-01-18 DIAGNOSIS — E782 Mixed hyperlipidemia: Secondary | ICD-10-CM | POA: Diagnosis not present

## 2020-01-18 DIAGNOSIS — I2510 Atherosclerotic heart disease of native coronary artery without angina pectoris: Secondary | ICD-10-CM

## 2020-01-18 NOTE — Patient Instructions (Signed)
Medication Instructions:  Your physician recommends that you continue on your current medications as directed. Please refer to the Current Medication list given to you today.  *If you need a refill on your cardiac medications before your next appointment, please call your pharmacy*   Lab Work: None  If you have labs (blood work) drawn today and your tests are completely normal, you will receive your results only by: . MyChart Message (if you have MyChart) OR . A paper copy in the mail If you have any lab test that is abnormal or we need to change your treatment, we will call you to review the results.   Testing/Procedures: None  Follow-Up: At [facility], you and your health needs are our priority.  As part of our continuing mission to provide you with exceptional heart care, we have created designated Provider Care Teams.  These Care Teams include your primary Cardiologist (physician) and Advanced Practice Providers (APPs -  Physician Assistants and Nurse Practitioners) who all work together to provide you with the care you need, when you need it.  We recommend signing up for the patient portal called "MyChart".  Sign up information is provided on this After Visit Summary.  MyChart is used to connect with patients for Virtual Visits (Telemedicine).  Patients are able to view lab/test results, encounter notes, upcoming appointments, etc.  Non-urgent messages can be sent to your provider as well.   To learn more about what you can do with MyChart, go to [email or web address].    Your next appointment:   12 month(s)  The format for your next appointment:   In Person  Provider:   You may see Jayadeep Varanasi, MD or one of the following Advanced Practice Providers on your designated Care Team:    Dayna Dunn, PA-C  Michele Lenze, PA-C    Other Instructions  High-Fiber Diet Fiber, also called dietary fiber, is a type of carbohydrate that is found in fruits, vegetables, whole  grains, and beans. A high-fiber diet can have many health benefits. Your health care provider may recommend a high-fiber diet to help:  Prevent constipation. Fiber can make your bowel movements more regular.  Lower your cholesterol.  Relieve the following conditions: ? Swelling of veins in the anus (hemorrhoids). ? Swelling and irritation (inflammation) of specific areas of the digestive tract (uncomplicated diverticulosis). ? A problem of the large intestine (colon) that sometimes causes pain and diarrhea (irritable bowel syndrome, IBS).  Prevent overeating as part of a weight-loss plan.  Prevent heart disease, type 2 diabetes, and certain cancers. What is my plan? The recommended daily fiber intake in grams (g) includes:  38 g for men age 50 or younger.  30 g for men over age 50.  25 g for women age 50 or younger.  21 g for women over age 50. You can get the recommended daily intake of dietary fiber by:  Eating a variety of fruits, vegetables, grains, and beans.  Taking a fiber supplement, if it is not possible to get enough fiber through your diet. What do I need to know about a high-fiber diet?  It is better to get fiber through food sources rather than from fiber supplements. There is not a lot of research about how effective supplements are.  Always check the fiber content on the nutrition facts label of any prepackaged food. Look for foods that contain 5 g of fiber or more per serving.  Talk with a diet and nutrition specialist (dietitian) if you   have questions about specific foods that are recommended or not recommended for your medical condition, especially if those foods are not listed below.  Gradually increase how much fiber you consume. If you increase your intake of dietary fiber too quickly, you may have bloating, cramping, or gas.  Drink plenty of water. Water helps you to digest fiber. What are tips for following this plan?  Eat a wide variety of high-fiber  foods.  Make sure that half of the grains that you eat each day are whole grains.  Eat breads and cereals that are made with whole-grain flour instead of refined flour or white flour.  Eat brown rice, bulgur wheat, or millet instead of white rice.  Start the day with a breakfast that is high in fiber, such as a cereal that contains 5 g of fiber or more per serving.  Use beans in place of meat in soups, salads, and pasta dishes.  Eat high-fiber snacks, such as berries, raw vegetables, nuts, and popcorn.  Choose whole fruits and vegetables instead of processed forms like juice or sauce. What foods can I eat?  Fruits Berries. Pears. Apples. Oranges. Avocado. Prunes and raisins. Dried figs. Vegetables Sweet potatoes. Spinach. Kale. Artichokes. Cabbage. Broccoli. Cauliflower. Green peas. Carrots. Squash. Grains Whole-grain breads. Multigrain cereal. Oats and oatmeal. Brown rice. Barley. Bulgur wheat. Millet. Quinoa. Bran muffins. Popcorn. Rye wafer crackers. Meats and other proteins Navy, kidney, and pinto beans. Soybeans. Split peas. Lentils. Nuts and seeds. Dairy Fiber-fortified yogurt. Beverages Fiber-fortified soy milk. Fiber-fortified orange juice. Other foods Fiber bars. The items listed above may not be a complete list of recommended foods and beverages. Contact a dietitian for more options. What foods are not recommended? Fruits Fruit juice. Cooked, strained fruit. Vegetables Fried potatoes. Canned vegetables. Well-cooked vegetables. Grains White bread. Pasta made with refined flour. White rice. Meats and other proteins Fatty cuts of meat. Fried chicken or fried fish. Dairy Milk. Yogurt. Cream cheese. Sour cream. Fats and oils Butters. Beverages Soft drinks. Other foods Cakes and pastries. The items listed above may not be a complete list of foods and beverages to avoid. Contact a dietitian for more information. Summary  Fiber is a type of carbohydrate. It is  found in fruits, vegetables, whole grains, and beans.  There are many health benefits of eating a high-fiber diet, such as preventing constipation, lowering blood cholesterol, helping with weight loss, and reducing your risk of heart disease, diabetes, and certain cancers.  Gradually increase your intake of fiber. Increasing too fast can result in cramping, bloating, and gas. Drink plenty of water while you increase your fiber.  The best sources of fiber include whole fruits and vegetables, whole grains, nuts, seeds, and beans. This information is not intended to replace advice given to you by your health care provider. Make sure you discuss any questions you have with your health care provider. Document Revised: 01/19/2017 Document Reviewed: 01/19/2017 Elsevier Patient Education  2020 Elsevier Inc.   

## 2020-04-07 ENCOUNTER — Other Ambulatory Visit: Payer: Self-pay | Admitting: Interventional Cardiology

## 2020-09-26 ENCOUNTER — Encounter: Payer: Self-pay | Admitting: *Deleted

## 2021-01-03 ENCOUNTER — Other Ambulatory Visit: Payer: Self-pay

## 2021-01-03 ENCOUNTER — Ambulatory Visit (INDEPENDENT_AMBULATORY_CARE_PROVIDER_SITE_OTHER): Payer: Managed Care, Other (non HMO) | Admitting: Family Medicine

## 2021-01-03 ENCOUNTER — Encounter: Payer: Self-pay | Admitting: Family Medicine

## 2021-01-03 VITALS — BP 130/82 | HR 79 | Temp 97.5°F | Resp 16 | Ht 70.0 in | Wt 257.4 lb

## 2021-01-03 DIAGNOSIS — Z0000 Encounter for general adult medical examination without abnormal findings: Secondary | ICD-10-CM | POA: Diagnosis not present

## 2021-01-03 DIAGNOSIS — Z125 Encounter for screening for malignant neoplasm of prostate: Secondary | ICD-10-CM | POA: Diagnosis not present

## 2021-01-03 DIAGNOSIS — E669 Obesity, unspecified: Secondary | ICD-10-CM | POA: Diagnosis not present

## 2021-01-03 DIAGNOSIS — Z1211 Encounter for screening for malignant neoplasm of colon: Secondary | ICD-10-CM | POA: Diagnosis not present

## 2021-01-03 LAB — CBC WITH DIFFERENTIAL/PLATELET
Basophils Absolute: 0 10*3/uL (ref 0.0–0.1)
Basophils Relative: 0.5 % (ref 0.0–3.0)
Eosinophils Absolute: 0.1 10*3/uL (ref 0.0–0.7)
Eosinophils Relative: 0.8 % (ref 0.0–5.0)
HCT: 42.7 % (ref 39.0–52.0)
Hemoglobin: 14.1 g/dL (ref 13.0–17.0)
Lymphocytes Relative: 17.9 % (ref 12.0–46.0)
Lymphs Abs: 1.4 10*3/uL (ref 0.7–4.0)
MCHC: 33 g/dL (ref 30.0–36.0)
MCV: 91.1 fl (ref 78.0–100.0)
Monocytes Absolute: 0.6 10*3/uL (ref 0.1–1.0)
Monocytes Relative: 8 % (ref 3.0–12.0)
Neutro Abs: 5.8 10*3/uL (ref 1.4–7.7)
Neutrophils Relative %: 72.8 % (ref 43.0–77.0)
Platelets: 168 10*3/uL (ref 150.0–400.0)
RBC: 4.69 Mil/uL (ref 4.22–5.81)
RDW: 15.4 % (ref 11.5–15.5)
WBC: 8 10*3/uL (ref 4.0–10.5)

## 2021-01-03 LAB — LIPID PANEL
Cholesterol: 155 mg/dL (ref 0–200)
HDL: 40.1 mg/dL (ref >39.00)
LDL Cholesterol: 95 mg/dL (ref 0–99)
NonHDL: 115.38
Total CHOL/HDL Ratio: 4
Triglycerides: 100 mg/dL (ref 0.0–149.0)
VLDL: 20 mg/dL (ref 0.0–40.0)

## 2021-01-03 LAB — HEPATIC FUNCTION PANEL
ALT: 80 U/L — ABNORMAL HIGH (ref 0–53)
AST: 42 U/L — ABNORMAL HIGH (ref 0–37)
Albumin: 4.3 g/dL (ref 3.5–5.2)
Alkaline Phosphatase: 108 U/L (ref 39–117)
Bilirubin, Direct: 0.2 mg/dL (ref 0.0–0.3)
Total Bilirubin: 0.8 mg/dL (ref 0.2–1.2)
Total Protein: 7.6 g/dL (ref 6.0–8.3)

## 2021-01-03 LAB — BASIC METABOLIC PANEL
BUN: 12 mg/dL (ref 6–23)
CO2: 29 mEq/L (ref 19–32)
Calcium: 9.5 mg/dL (ref 8.4–10.5)
Chloride: 101 mEq/L (ref 96–112)
Creatinine, Ser: 1.21 mg/dL (ref 0.40–1.50)
GFR: 63.03 mL/min (ref >60.00)
Glucose, Bld: 134 mg/dL — ABNORMAL HIGH (ref 70–99)
Potassium: 4.2 mEq/L (ref 3.5–5.1)
Sodium: 137 mEq/L (ref 135–145)

## 2021-01-03 LAB — TSH: TSH: 1.61 u[IU]/mL (ref 0.35–5.50)

## 2021-01-03 LAB — PSA: PSA: 0.6 ng/mL (ref 0.10–4.00)

## 2021-01-03 NOTE — Assessment & Plan Note (Signed)
Ongoing issue for pt.  BMI 36.93  He reports he is trying to lose some of the weight he gained during [place].  Encouraged healthy diet and regular exercise.  Will follow.

## 2021-01-03 NOTE — Progress Notes (Signed)
   Subjective:    Patient ID: Eric Rosales, male    DOB: Jul 31, 1955, 65 y.o.   MRN: [id number]  HPI CPE- UTD on Tdap.  Will get flu shot at work.  Due for repeat colon cancer screen.  Patient Care Team    Relationship Specialty Notifications Start End  Midge Minium, MD PCP - General   03/13/10   Jettie Booze, MD PCP - Cardiology Cardiology Admissions 10/20/18     Health Maintenance  Topic Date Due   HIV Screening  Never done   Hepatitis C Screening  Never done   Zoster Vaccines- Shingrix (1 of 2) Never done   COVID-19 Vaccine (4 - Booster for Moderna series) 06/17/2020   INFLUENZA VACCINE  06/28/2021 (Originally 10/29/2020)   COLONOSCOPY (Pts 45-78yrs Insurance coverage will need to be confirmed)  03/31/2048 (Originally 11/09/2012)   TETANUS/TDAP  05/09/2025   HPV VACCINES  Aged Out     Review of Systems Patient reports no vision/hearing changes, anorexia, fever ,adenopathy, persistant/recurrent hoarseness, swallowing issues, chest pain, palpitations, edema, persistant/recurrent cough, hemoptysis, dyspnea (rest,exertional, paroxysmal nocturnal), gastrointestinal  bleeding (melena, rectal bleeding), abdominal pain, excessive heart burn, GU symptoms (dysuria, hematuria, voiding/incontinence issues) syncope, focal weakness, memory loss, numbness & tingling, skin/hair/nail changes, depression, anxiety, abnormal bruising/bleeding, musculoskeletal symptoms/signs.   This visit occurred during the SARS-CoV-2 public health emergency.  Safety protocols were in place, including screening questions prior to the visit, additional usage of staff PPE, and extensive cleaning of exam room while observing appropriate contact time as indicated for disinfecting solutions.      Objective:   Physical Exam General Appearance:    Alert, cooperative, no distress, appears stated age, obese  Head:    Normocephalic, without obvious abnormality, atraumatic  Eyes:    PERRL, conjunctiva/corneas clear,  EOM's intact, fundi    benign, both eyes       Ears:    Normal TM's and external ear canals, both ears  Nose:   Deferred due to COVID  Throat:   Neck:   Supple, symmetrical, trachea midline, no adenopathy;       thyroid:  No enlargement/tenderness/nodules  Back:     Symmetric, no curvature, ROM normal, no CVA tenderness  Lungs:     Clear to auscultation bilaterally, respirations unlabored  Chest wall:    No tenderness or deformity  Heart:    Regular rate and rhythm, S1 and S2 normal, no murmur, rub   or gallop  Abdomen:     Soft, non-tender, bowel sounds active all four quadrants,    no masses, no organomegaly  Genitalia:    deferred  Rectal:    Extremities:   Extremities normal, atraumatic, no cyanosis or edema  Pulses:   2+ and symmetric all extremities  Skin:   Skin color, texture, turgor normal, no rashes or lesions  Lymph nodes:   Cervical, supraclavicular, and axillary nodes normal  Neurologic:   CNII-XII intact. Normal strength, sensation and reflexes      throughout          Assessment & Plan:

## 2021-01-03 NOTE — Patient Instructions (Signed)
Follow up in 1 year or as needed We'll notify you of your lab results and make any changes if needed Continue to work on healthy diet and regular exercise- you can do it!! We'll call you with your GI referral for the colonoscopy Call with any questions or concerns Stay Safe!  Stay Healthy! Happy Fall!!!

## 2021-01-03 NOTE — Assessment & Plan Note (Signed)
Pt's PE WNL w/ exception of obesity.  UTD on Tdap.  Will get flu shot at work.  Overdue for colon cancer screen- will refer.  Check labs.  Anticipatory guidance provided.

## 2021-01-04 ENCOUNTER — Other Ambulatory Visit: Payer: Self-pay

## 2021-01-04 ENCOUNTER — Other Ambulatory Visit (INDEPENDENT_AMBULATORY_CARE_PROVIDER_SITE_OTHER): Payer: Managed Care, Other (non HMO)

## 2021-01-04 DIAGNOSIS — R7989 Other specified abnormal findings of blood chemistry: Secondary | ICD-10-CM

## 2021-01-04 DIAGNOSIS — R7309 Other abnormal glucose: Secondary | ICD-10-CM

## 2021-01-04 LAB — HEMOGLOBIN A1C: Hgb A1c MFr Bld: 8 % — ABNORMAL HIGH (ref 4.6–6.5)

## 2021-01-04 NOTE — Addendum Note (Signed)
Addended by: Octavio Manns E on: 01/04/2021 03:35 PM   Modules accepted: Orders

## 2021-01-08 ENCOUNTER — Other Ambulatory Visit: Payer: Self-pay | Admitting: Interventional Cardiology

## 2021-01-08 ENCOUNTER — Other Ambulatory Visit: Payer: Self-pay

## 2021-01-08 DIAGNOSIS — E119 Type 2 diabetes mellitus without complications: Secondary | ICD-10-CM

## 2021-01-08 MED ORDER — METFORMIN HCL 500 MG PO TABS: 500.0000 mg | ORAL_TABLET | Freq: Two times a day (BID) | ORAL | 3 refills | Status: DC

## 2021-01-16 NOTE — Addendum Note (Signed)
Addended by: Midge Minium on: 01/16/2021 03:26 PM   Modules accepted: Orders

## 2021-01-18 ENCOUNTER — Other Ambulatory Visit (INDEPENDENT_AMBULATORY_CARE_PROVIDER_SITE_OTHER): Payer: Managed Care, Other (non HMO)

## 2021-01-18 ENCOUNTER — Other Ambulatory Visit: Payer: Self-pay

## 2021-01-18 DIAGNOSIS — R7989 Other specified abnormal findings of blood chemistry: Secondary | ICD-10-CM | POA: Diagnosis not present

## 2021-01-18 LAB — HEPATIC FUNCTION PANEL
ALT: 62 U/L — ABNORMAL HIGH (ref 0–53)
AST: 37 U/L (ref 0–37)
Albumin: 4.4 g/dL (ref 3.5–5.2)
Alkaline Phosphatase: 90 U/L (ref 39–117)
Bilirubin, Direct: 0.2 mg/dL (ref 0.0–0.3)
Total Bilirubin: 0.8 mg/dL (ref 0.2–1.2)
Total Protein: 7.7 g/dL (ref 6.0–8.3)

## 2021-02-05 ENCOUNTER — Other Ambulatory Visit: Payer: Self-pay

## 2021-02-05 ENCOUNTER — Encounter: Payer: Self-pay | Admitting: Interventional Cardiology

## 2021-02-05 ENCOUNTER — Ambulatory Visit: Payer: Managed Care, Other (non HMO) | Admitting: Interventional Cardiology

## 2021-02-05 VITALS — BP 146/84 | HR 80 | Ht 70.0 in | Wt 254.8 lb

## 2021-02-05 DIAGNOSIS — I2510 Atherosclerotic heart disease of native coronary artery without angina pectoris: Secondary | ICD-10-CM

## 2021-02-05 DIAGNOSIS — R7989 Other specified abnormal findings of blood chemistry: Secondary | ICD-10-CM

## 2021-02-05 DIAGNOSIS — R9431 Abnormal electrocardiogram [ECG] [EKG]: Secondary | ICD-10-CM | POA: Diagnosis not present

## 2021-02-05 DIAGNOSIS — E782 Mixed hyperlipidemia: Secondary | ICD-10-CM

## 2021-02-05 NOTE — Progress Notes (Signed)
[facility]    Cardiology Office Note   Date:  02/05/2021   ID:  Eric Rosales, DOB 09/14/1955, MRN [id number]  PCP:  Midge Minium, MD    No chief complaint on file.  CAD  Wt Readings from Last 3 Encounters:  02/05/21 254 lb 12.8 oz (115.6 kg)  01/03/21 257 lb 6.4 oz (116.8 kg)  01/18/20 259 lb 3.2 oz (117.6 kg)       History of Present Illness: Eric Rosales is a 65 y.o. male  Eric Rosales is a 65 y.o. male  Who underwent a coronary intervention in 2012 with a drug-eluting stent to the proximal dominant LAD. Plavix resistant so Effient was used.  Procedure done by Dr. Lia Foyer.  Angina was present when he started walking the treadmill.  It felt like an indigestion with exercise that resolved with rest.  It was reproducible with exercise.    His angina was exercise induced nausea, relieved with rest.  THis prompted the cath.  He had a 3.5 x 20 Promus Element stent to the prox LAD.    We discussed stress testing in 2018, but he was feeling well so declined.  He got his COVID shots.     He works as an Chief Financial Officer at [employer].   Denies : Chest pain. Dizziness. Leg edema. Nitroglycerin use. Orthopnea. Palpitations. Paroxysmal nocturnal dyspnea. Shortness of breath. Syncope.    Eats lot of vegetables, fruits.  No sx like his angina prior stent.   Past Medical History:  Diagnosis Date   CAD (coronary artery disease)    January, 2012, DES proximal dominant LAD at the diagonal takeoff ( P2Y12-no inhibition by Plavix) Effient used   Easy bruisability    October, 2012   Hyperlipidemia    Weight gain    October, 2012    Past Surgical History:  Procedure Laterality Date   CORONARY STENT PLACEMENT  2012     Current Outpatient Medications  Medication Sig Dispense Refill   Ascorbic Acid (VITAMIN C) 500 MG tablet Take 500 mg by mouth daily.       aspirin 81 MG tablet Take 1 tablet (81 mg total) by mouth daily.     atorvastatin (LIPITOR) 40 MG tablet TAKE 1 TABLET BY MOUTH DAILY AT 6  PM. 90 tablet 0   CIALIS 20 MG tablet TAKE 1/2-1 TABLET BY MOUTH EVERY OTHER DAY AS NEEDED 6 tablet 2   fluticasone (FLONASE) 50 MCG/ACT nasal spray Place 2 sprays into both nostrils daily. 16 g 6   meclizine (ANTIVERT) 25 MG tablet Take 1 tablet (25 mg total) by mouth 3 (three) times daily as needed. 45 tablet 0   metFORMIN (GLUCOPHAGE) 500 MG tablet Take 1 tablet (500 mg total) by mouth 2 (two) times daily with a meal. 180 tablet 3   Multiple Vitamin (MULTIVITAMIN) capsule Take 1 capsule by mouth daily.       nitroGLYCERIN (NITROSTAT) 0.4 MG SL tablet Place 1 tablet (0.4 mg total) under the tongue every 5 (five) minutes as needed for chest pain. 3 DOSES MAX 30 tablet 1   Turmeric 500 MG CAPS Take 1,000 mg by mouth daily.     No current facility-administered medications for this visit.    Allergies:   Patient has no known allergies.    Social History:  The patient  reports that he has never smoked. He has never used smokeless tobacco. He reports current alcohol use of about 1.0 standard drink per week. He reports that he does  not use drugs.   Family History:  The patient's family history includes Diabetes in his brother, mother, and sister; Hypertension in an other family member; Stroke in his mother.    ROS:  Please see the history of present illness.   Otherwise, review of systems are positive for intentional weight loss.   All other systems are reviewed and negative.    PHYSICAL EXAM: VS:  BP (!) 146/84   Pulse 80   Ht 5\' 10"  (1.778 m)   Wt 254 lb 12.8 oz (115.6 kg)   SpO2 93%   BMI 36.56 kg/m  , BMI Body mass index is 36.56 kg/m. GEN: Well nourished, well developed, in no acute distress HEENT: normal Neck: no JVD, carotid bruits, or masses Cardiac: RRR; no murmurs, rubs, or gallops,no edema  Respiratory:  clear to auscultation bilaterally, normal work of breathing GI: soft, nontender, nondistended, + BS, obese MS: no deformity or atrophy Skin: warm and dry, no rash Neuro:   Strength and sensation are intact Psych: euthymic mood, full affect   EKG:   The ekg ordered today demonstrates NSR, nonspecific ST changes; less pronounced than 07/01/2019 ECG   Recent Labs: 01/03/2021: BUN 12; Creatinine, Ser 1.21; Hemoglobin 14.1; Platelets 168.0; Potassium 4.2; Sodium 137; TSH 1.61 01/18/2021: ALT 62   Lipid Panel    Component Value Date/Time   CHOL 155 01/03/2021 1258   TRIG 100.0 01/03/2021 1258   HDL 40.10 01/03/2021 1258   CHOLHDL 4 01/03/2021 1258   VLDL 20.0 01/03/2021 1258   LDLCALC 95 01/03/2021 1258   LDLDIRECT 139.5 03/19/2012 1059     Other studies Reviewed: Additional studies/ records that were reviewed today with results demonstrating: labs reviewed.   ASSESSMENT AND PLAN:  CAD: No angina on medical therapy.  Continue prasugrel.   Hyperlipidemia: LDL 95.  Would like to see LDL at 70. Continue statin.  Would not increase since he has some increased LFTs.   Discussed switching to rosuvastatin,  but he prefers to try lifestyle changes and see if LDL improves. Abnormal ECG: Changes less pronounced.   Elevated LFTs: noted in 10/2019.  LFTs still mildly elevated in 12/2020. Repeat liver and lipids in 3 months.  Consider switching to Crestor if the liver tests are still elevated or if the cholesterol not quite at target. Readings at home are usually in the 120/70s.  If the blood pressure was consistently over 212 systolic, then I would add an ACE-I like lisinopril.  DM: A1C 8.0.  Increase exercise and fiber intake.  Morbid obesity: he is working on weight loss.    Current medicines are reviewed at length with the patient today.  The patient concerns regarding his medicines were addressed.  The following changes have been made:  No change  Labs/ tests ordered today include:  No orders of the defined types were placed in this encounter.   Recommend 150 minutes/week of aerobic exercise Low fat, low carb, high fiber diet recommended  Disposition:   FU  in 1 year   Signed, Larae Grooms, MD  02/05/2021 4:17 PM    [facility] [address], [place], [place]  [address] Phone: [phone]; Fax: [phone]

## 2021-02-05 NOTE — Patient Instructions (Signed)
Medication Instructions:  Your physician recommends that you continue on your current medications as directed. Please refer to the Current Medication list given to you today.  *If you need a refill on your cardiac medications before your next appointment, please call your pharmacy*   Lab Work: Your physician recommends that you return for lab work in: 3 months.  Lipid and liver profiles. This will be fasting.  The lab opens at 7:30 AM  If you have labs (blood work) drawn today and your tests are completely normal, you will receive your results only by: [place] (if you have MyChart) OR A paper copy in the mail If you have any lab test that is abnormal or we need to change your treatment, we will call you to review the results.   Testing/Procedures: none   Follow-Up: At [facility], you and your health needs are our priority.  As part of our continuing mission to provide you with exceptional heart care, we have created designated Provider Care Teams.  These Care Teams include your primary Cardiologist (physician) and Advanced Practice Providers (APPs -  Physician Assistants and Nurse Practitioners) who all work together to provide you with the care you need, when you need it.  We recommend signing up for the patient portal called "MyChart".  Sign up information is provided on this After Visit Summary.  MyChart is used to connect with patients for Virtual Visits (Telemedicine).  Patients are able to view lab/test results, encounter notes, upcoming appointments, etc.  Non-urgent messages can be sent to your provider as well.   To learn more about what you can do with MyChart, go to [email or web address].    Your next appointment:   12 month(s)  The format for your next appointment:   In Person  Provider:   Larae Grooms, MD     Other Instructions  High-Fiber Eating Plan Fiber, also called dietary fiber, is a type of carbohydrate. It is found foods such as fruits,  vegetables, whole grains, and beans. A high-fiber diet can have many health benefits. Your health care provider may recommend a high-fiber diet to help: Prevent constipation. Fiber can make your bowel movements more regular. Lower your cholesterol. Relieve the following conditions: Inflammation of veins in the anus (hemorrhoids). Inflammation of specific areas of the digestive tract (uncomplicated diverticulosis). A problem of the large intestine, also called the colon, that sometimes causes pain and diarrhea (irritable bowel syndrome, or IBS). Prevent overeating as part of a weight-loss plan. Prevent heart disease, type 2 diabetes, and certain cancers. What are tips for following this plan? Reading food labels  Check the nutrition facts label on food products for the amount of dietary fiber. Choose foods that have 5 grams of fiber or more per serving. The goals for recommended daily fiber intake include: Men (age 60 or younger): 34-38 g. Men (over age 20): 28-34 g. Women (age 4 or younger): 25-28 g. Women (over age 53): 22-25 g. Your daily fiber goal is _____________ g. Shopping Choose whole fruits and vegetables instead of processed forms, such as apple juice or applesauce. Choose a wide variety of high-fiber foods such as avocados, lentils, oats, and kidney beans. Read the nutrition facts label of the foods you choose. Be aware of foods with added fiber. These foods often have high sugar and sodium amounts per serving. Cooking Use whole-grain flour for baking and cooking. Cook with brown rice instead of white rice. Meal planning Start the day with a breakfast that is high  in fiber, such as a cereal that contains 5 g of fiber or more per serving. Eat breads and cereals that are made with whole-grain flour instead of refined flour or white flour. Eat brown rice, bulgur wheat, or millet instead of white rice. Use beans in place of meat in soups, salads, and pasta dishes. Be sure that  half of the grains you eat each day are whole grains. General information You can get the recommended daily intake of dietary fiber by: Eating a variety of fruits, vegetables, grains, nuts, and beans. Taking a fiber supplement if you are not able to take in enough fiber in your diet. It is better to get fiber through food than from a supplement. Gradually increase how much fiber you consume. If you increase your intake of dietary fiber too quickly, you may have bloating, cramping, or gas. Drink plenty of water to help you digest fiber. Choose high-fiber snacks, such as berries, raw vegetables, nuts, and popcorn. What foods should I eat? Fruits Berries. Pears. Apples. Oranges. Avocado. Prunes and raisins. Dried figs. Vegetables Sweet potatoes. Spinach. Kale. Artichokes. Cabbage. Broccoli. Cauliflower. Green peas. Carrots. Squash. Grains Whole-grain breads. Multigrain cereal. Oats and oatmeal. Brown rice. Barley. Bulgur wheat. [place]. Quinoa. Bran muffins. Popcorn. Rye wafer crackers. Meats and other proteins Navy beans, kidney beans, and pinto beans. Soybeans. Split peas. Lentils. Nuts and seeds. Dairy Fiber-fortified yogurt. Beverages Fiber-fortified soy milk. Fiber-fortified orange juice. Other foods Fiber bars. The items listed above may not be a complete list of recommended foods and beverages. Contact a dietitian for more information. What foods should I avoid? Fruits Fruit juice. Cooked, strained fruit. Vegetables Fried potatoes. Canned vegetables. Well-cooked vegetables. Grains White bread. Pasta made with refined flour. White rice. Meats and other proteins Fatty cuts of meat. Fried chicken or fried fish. Dairy Milk. Yogurt. Cream cheese. Sour cream. Fats and oils Butters. Beverages Soft drinks. Other foods Cakes and pastries. The items listed above may not be a complete list of foods and beverages to avoid. Talk with your dietitian about what choices are best for  you. Summary Fiber is a type of carbohydrate. It is found in foods such as fruits, vegetables, whole grains, and beans. A high-fiber diet has many benefits. It can help to prevent constipation, lower blood cholesterol, aid weight loss, and reduce your risk of heart disease, diabetes, and certain cancers. Increase your intake of fiber gradually. Increasing fiber too quickly may cause cramping, bloating, and gas. Drink plenty of water while you increase the amount of fiber you consume. The best sources of fiber include whole fruits and vegetables, whole grains, nuts, seeds, and beans. This information is not intended to replace advice given to you by your health care provider. Make sure you discuss any questions you have with your health care provider. Document Revised: 07/21/2019 Document Reviewed: 07/21/2019 Elsevier Patient Education  2022 [employer].

## 2021-04-05 ENCOUNTER — Other Ambulatory Visit: Payer: Self-pay | Admitting: Interventional Cardiology

## 2021-05-07 ENCOUNTER — Other Ambulatory Visit: Payer: Self-pay

## 2021-05-07 ENCOUNTER — Other Ambulatory Visit: Payer: Managed Care, Other (non HMO) | Admitting: *Deleted

## 2021-05-07 DIAGNOSIS — E782 Mixed hyperlipidemia: Secondary | ICD-10-CM

## 2021-05-07 DIAGNOSIS — R7989 Other specified abnormal findings of blood chemistry: Secondary | ICD-10-CM

## 2021-05-07 DIAGNOSIS — I2510 Atherosclerotic heart disease of native coronary artery without angina pectoris: Secondary | ICD-10-CM

## 2021-05-07 LAB — LIPID PANEL
Chol/HDL Ratio: 3.5 ratio (ref 0.0–5.0)
Cholesterol, Total: 156 mg/dL (ref 100–199)
HDL: 45 mg/dL (ref >39)
LDL Chol Calc (NIH): 93 mg/dL (ref 0–99)
Triglycerides: 97 mg/dL (ref 0–149)
VLDL Cholesterol Cal: 18 mg/dL (ref 5–40)

## 2021-05-07 LAB — HEPATIC FUNCTION PANEL
ALT: 46 IU/L — ABNORMAL HIGH (ref 0–44)
AST: 28 IU/L (ref 0–40)
Albumin: 4.4 g/dL (ref 3.8–4.8)
Alkaline Phosphatase: 114 IU/L (ref 44–121)
Bilirubin Total: 0.7 mg/dL (ref 0.0–1.2)
Bilirubin, Direct: 0.19 mg/dL (ref 0.00–0.40)
Total Protein: 7.6 g/dL (ref 6.0–8.5)

## 2021-05-08 ENCOUNTER — Other Ambulatory Visit: Payer: Managed Care, Other (non HMO)

## 2021-05-10 ENCOUNTER — Telehealth: Payer: Self-pay | Admitting: *Deleted

## 2021-05-10 NOTE — Telephone Encounter (Signed)
-----   Message from Jettie Booze, MD sent at 05/08/2021  6:00 PM EST ----- Rosuvastatin 40 mg daily

## 2021-05-10 NOTE — Telephone Encounter (Signed)
Jettie Booze, MD  05/08/2021  4:54 PM EST     LFT stable.  LDL 93. WOuld like to see LDL closer to 70 given prior stent. Can change to rosuvastatin to see if this helps get to target.  Recheck lipids and liver tests in 2-3 months

## 2021-05-10 NOTE — Telephone Encounter (Signed)
Left message to call office

## 2021-05-20 NOTE — Telephone Encounter (Signed)
Left message to call office.  Result message sent to patient through my chart

## 2021-05-31 ENCOUNTER — Encounter: Payer: Self-pay | Admitting: Interventional Cardiology

## 2021-05-31 DIAGNOSIS — E782 Mixed hyperlipidemia: Secondary | ICD-10-CM

## 2021-05-31 NOTE — Progress Notes (Signed)
3 attempts to reach pt to review lab results.  Letter sent requesting pt contact the office.  ?

## 2021-06-03 NOTE — Telephone Encounter (Signed)
See mychart message from patient.

## 2021-09-03 ENCOUNTER — Other Ambulatory Visit: Payer: BC Managed Care – PPO

## 2021-09-03 DIAGNOSIS — E782 Mixed hyperlipidemia: Secondary | ICD-10-CM

## 2021-09-03 LAB — LIPID PANEL
Chol/HDL Ratio: 3.9 ratio (ref 0.0–5.0)
Cholesterol, Total: 159 mg/dL (ref 100–199)
HDL: 41 mg/dL (ref >39)
LDL Chol Calc (NIH): 99 mg/dL (ref 0–99)
Triglycerides: 104 mg/dL (ref 0–149)
VLDL Cholesterol Cal: 19 mg/dL (ref 5–40)

## 2021-09-06 ENCOUNTER — Other Ambulatory Visit: Payer: Self-pay

## 2021-09-06 MED ORDER — ATORVASTATIN CALCIUM 40 MG PO TABS: ORAL_TABLET | ORAL | 1 refills | Status: DC

## 2021-09-11 ENCOUNTER — Telehealth: Payer: Self-pay | Admitting: *Deleted

## 2021-09-11 NOTE — Telephone Encounter (Signed)
-----   Message from Jettie Booze, MD sent at 09/08/2021 11:22 PM EDT ----- LDL still at 99, above target of 70.  WOuld switch to Crestor 20 mg daily and check LFTs and lipids in 2 months.  Otherwise, could refer to lipid clinic and consider nonstatin alternatives.

## 2021-09-11 NOTE — Telephone Encounter (Signed)
Left message to call office

## 2021-09-17 ENCOUNTER — Other Ambulatory Visit: Payer: Self-pay

## 2021-09-17 DIAGNOSIS — E119 Type 2 diabetes mellitus without complications: Secondary | ICD-10-CM

## 2021-09-17 MED ORDER — METFORMIN HCL 500 MG PO TABS: 500.0000 mg | ORAL_TABLET | Freq: Two times a day (BID) | ORAL | 3 refills | Status: DC

## 2021-09-17 NOTE — Telephone Encounter (Signed)
Left message to call office

## 2021-09-24 ENCOUNTER — Encounter: Payer: Self-pay | Admitting: Interventional Cardiology

## 2021-09-24 DIAGNOSIS — E782 Mixed hyperlipidemia: Secondary | ICD-10-CM

## 2021-09-25 MED ORDER — ROSUVASTATIN CALCIUM 20 MG PO TABS: 20.0000 mg | ORAL_TABLET | Freq: Every day | ORAL | 3 refills | Status: DC

## 2021-09-25 NOTE — Telephone Encounter (Signed)
Patient would like to change to Crestor.  See my chart message

## 2021-09-25 NOTE — Telephone Encounter (Signed)
Comments from Lab notes- LDL still at 99, above target of 70.  WOuld switch to Crestor 20 mg daily and check LFTs and lipids in 2 months.  Otherwise, could refer to lipid clinic and consider nonstatin alternatives

## 2021-09-27 ENCOUNTER — Other Ambulatory Visit: Payer: Self-pay

## 2021-09-27 MED ORDER — ROSUVASTATIN CALCIUM 20 MG PO TABS: 20.0000 mg | ORAL_TABLET | Freq: Every day | ORAL | 1 refills | Status: DC

## 2021-10-09 ENCOUNTER — Telehealth: Payer: Self-pay

## 2021-10-09 NOTE — Telephone Encounter (Signed)
Received MyChart unread message notification. Called patient and left message to verify that he received his increased dose of Crestor '20mg'$  QD with f/u labs scheduled for 11/25/21.  Provided office number for callback if any questions or need to change lab appt.

## 2021-11-25 ENCOUNTER — Ambulatory Visit: Payer: Medicare Other | Attending: Interventional Cardiology

## 2021-11-25 DIAGNOSIS — E782 Mixed hyperlipidemia: Secondary | ICD-10-CM | POA: Diagnosis not present

## 2021-11-25 LAB — LIPID PANEL
Chol/HDL Ratio: 3 ratio (ref 0.0–5.0)
Cholesterol, Total: 136 mg/dL (ref 100–199)
HDL: 45 mg/dL (ref >39)
LDL Chol Calc (NIH): 75 mg/dL (ref 0–99)
Triglycerides: 82 mg/dL (ref 0–149)
VLDL Cholesterol Cal: 16 mg/dL (ref 5–40)

## 2021-11-25 LAB — HEPATIC FUNCTION PANEL
ALT: 35 IU/L (ref 0–44)
AST: 23 IU/L (ref 0–40)
Albumin: 4.5 g/dL (ref 3.9–4.9)
Alkaline Phosphatase: 94 IU/L (ref 44–121)
Bilirubin Total: 0.6 mg/dL (ref 0.0–1.2)
Bilirubin, Direct: 0.19 mg/dL (ref 0.00–0.40)
Total Protein: 7.4 g/dL (ref 6.0–8.5)

## 2021-11-27 ENCOUNTER — Other Ambulatory Visit: Payer: Self-pay | Admitting: *Deleted

## 2021-11-27 DIAGNOSIS — E782 Mixed hyperlipidemia: Secondary | ICD-10-CM

## 2021-11-27 DIAGNOSIS — R7989 Other specified abnormal findings of blood chemistry: Secondary | ICD-10-CM

## 2021-11-27 NOTE — Progress Notes (Signed)
Liver profile-05/29/22

## 2021-12-25 ENCOUNTER — Other Ambulatory Visit: Payer: Self-pay

## 2021-12-25 DIAGNOSIS — E119 Type 2 diabetes mellitus without complications: Secondary | ICD-10-CM

## 2021-12-25 MED ORDER — ROSUVASTATIN CALCIUM 20 MG PO TABS: 20.0000 mg | ORAL_TABLET | Freq: Every day | ORAL | 0 refills | Status: DC

## 2021-12-25 MED ORDER — METFORMIN HCL 500 MG PO TABS: 500.0000 mg | ORAL_TABLET | Freq: Two times a day (BID) | ORAL | 3 refills | Status: DC

## 2022-01-07 ENCOUNTER — Ambulatory Visit (INDEPENDENT_AMBULATORY_CARE_PROVIDER_SITE_OTHER): Payer: Medicare Other | Admitting: Family Medicine

## 2022-01-07 ENCOUNTER — Encounter: Payer: Self-pay | Admitting: Family Medicine

## 2022-01-07 VITALS — BP 132/88 | HR 73 | Temp 98.8°F | Resp 16 | Ht 70.0 in | Wt 240.4 lb

## 2022-01-07 DIAGNOSIS — Z23 Encounter for immunization: Secondary | ICD-10-CM | POA: Diagnosis not present

## 2022-01-07 DIAGNOSIS — Z0000 Encounter for general adult medical examination without abnormal findings: Secondary | ICD-10-CM

## 2022-01-07 DIAGNOSIS — E119 Type 2 diabetes mellitus without complications: Secondary | ICD-10-CM | POA: Diagnosis not present

## 2022-01-07 DIAGNOSIS — Z1211 Encounter for screening for malignant neoplasm of colon: Secondary | ICD-10-CM

## 2022-01-07 DIAGNOSIS — Z125 Encounter for screening for malignant neoplasm of prostate: Secondary | ICD-10-CM

## 2022-01-07 LAB — CBC WITH DIFFERENTIAL/PLATELET
Basophils Absolute: 0 10*3/uL (ref 0.0–0.1)
Basophils Relative: 0.6 % (ref 0.0–3.0)
Eosinophils Absolute: 0.2 10*3/uL (ref 0.0–0.7)
Eosinophils Relative: 2 % (ref 0.0–5.0)
HCT: 43.2 % (ref 39.0–52.0)
Hemoglobin: 14.2 g/dL (ref 13.0–17.0)
Lymphocytes Relative: 22.5 % (ref 12.0–46.0)
Lymphs Abs: 1.9 10*3/uL (ref 0.7–4.0)
MCHC: 32.8 g/dL (ref 30.0–36.0)
MCV: 92 fl (ref 78.0–100.0)
Monocytes Absolute: 0.9 10*3/uL (ref 0.1–1.0)
Monocytes Relative: 10.5 % (ref 3.0–12.0)
Neutro Abs: 5.4 10*3/uL (ref 1.4–7.7)
Neutrophils Relative %: 64.4 % (ref 43.0–77.0)
Platelets: 215 10*3/uL (ref 150.0–400.0)
RBC: 4.69 Mil/uL (ref 4.22–5.81)
RDW: 15.4 % (ref 11.5–15.5)
WBC: 8.3 10*3/uL (ref 4.0–10.5)

## 2022-01-07 LAB — BASIC METABOLIC PANEL
BUN: 12 mg/dL (ref 6–23)
CO2: 28 mEq/L (ref 19–32)
Calcium: 9.8 mg/dL (ref 8.4–10.5)
Chloride: 97 mEq/L (ref 96–112)
Creatinine, Ser: 1.1 mg/dL (ref 0.40–1.50)
GFR: 70.17 mL/min (ref >60.00)
Glucose, Bld: 110 mg/dL — ABNORMAL HIGH (ref 70–99)
Potassium: 4.5 mEq/L (ref 3.5–5.1)
Sodium: 133 mEq/L — ABNORMAL LOW (ref 135–145)

## 2022-01-07 LAB — LIPID PANEL
Cholesterol: 139 mg/dL (ref 0–200)
HDL: 41.7 mg/dL (ref >39.00)
LDL Cholesterol: 72 mg/dL (ref 0–99)
NonHDL: 97.02
Total CHOL/HDL Ratio: 3
Triglycerides: 124 mg/dL (ref 0.0–149.0)
VLDL: 24.8 mg/dL (ref 0.0–40.0)

## 2022-01-07 LAB — HEMOGLOBIN A1C: Hgb A1c MFr Bld: 7 % — ABNORMAL HIGH (ref 4.6–6.5)

## 2022-01-07 LAB — HEPATIC FUNCTION PANEL
ALT: 47 U/L (ref 0–53)
AST: 26 U/L (ref 0–37)
Albumin: 4.3 g/dL (ref 3.5–5.2)
Alkaline Phosphatase: 84 U/L (ref 39–117)
Bilirubin, Direct: 0.2 mg/dL (ref 0.0–0.3)
Total Bilirubin: 0.6 mg/dL (ref 0.2–1.2)
Total Protein: 7.7 g/dL (ref 6.0–8.3)

## 2022-01-07 LAB — PSA, MEDICARE: PSA: 1.23 ng/ml (ref 0.10–4.00)

## 2022-01-07 LAB — MICROALBUMIN / CREATININE URINE RATIO
Creatinine,U: 180 mg/dL
Microalb Creat Ratio: 1.5 mg/g (ref 0.0–30.0)
Microalb, Ur: 2.7 mg/dL — ABNORMAL HIGH (ref 0.0–1.9)

## 2022-01-07 LAB — TSH: TSH: 1.86 u[IU]/mL (ref 0.35–5.50)

## 2022-01-07 NOTE — Assessment & Plan Note (Signed)
New dx for pt last year.  Pt did not follow up as directed.  He is down 15 lbs since last visit.  Plans to schedule eye exam.  Foot exam done today.  microalbumin ordered.  Check labs and adjust meds prn.

## 2022-01-07 NOTE — Assessment & Plan Note (Signed)
Pt's PE WNL w/ exception of BMI.  He is now willing to proceed w/ colonoscopy- referral placed.  Flu shot given.  Declines PNA vaccine.  Check labs.  Anticipatory guidance provided.

## 2022-01-07 NOTE — Progress Notes (Signed)
   Subjective:    Patient ID: Eric Rosales, male    DOB: January 14, 1956, 66 y.o.   MRN: [id number]  HPI CPE- declines PNA vaccines.  Will get flu today.  Eye exam due- pt to schedule.  Pt willing to proceed w/ colonoscopy  Patient Care Team    Relationship Specialty Notifications Start End  Midge Minium, MD PCP - General   03/13/10   Jettie Booze, MD PCP - Cardiology Cardiology Admissions 10/20/18     Health Maintenance  Topic Date Due   INFLUENZA VACCINE  10/29/2021   Zoster Vaccines- Shingrix (1 of 2) 04/09/2022 (Originally 12/13/1974)   Pneumonia Vaccine 56+ Years old (1 - PCV) 01/08/2023 (Originally 12/12/2020)   COLONOSCOPY (Pts 45-49yr Insurance coverage will need to be confirmed)  03/31/2048 (Originally 11/09/2012)   TETANUS/TDAP  05/09/2025   HPV VACCINES  Aged Out   COVID-19 Vaccine  Discontinued   Hepatitis C Screening  Discontinued      Review of Systems Patient reports no vision/hearing changes, anorexia, fever ,adenopathy, persistant/recurrent hoarseness, swallowing issues, chest pain, palpitations, edema, persistant/recurrent cough, hemoptysis, dyspnea (rest,exertional, paroxysmal nocturnal), gastrointestinal  bleeding (melena, rectal bleeding), abdominal pain, excessive heart burn, GU symptoms (dysuria, hematuria, voiding/incontinence issues) syncope, focal weakness, memory loss, numbness & tingling, skin/hair/nail changes, depression, anxiety, abnormal bruising/bleeding, musculoskeletal symptoms/signs.   + 15 lb weight loss    Objective:   Physical Exam General Appearance:    Alert, cooperative, no distress, appears stated age, obese  Head:    Normocephalic, without obvious abnormality, atraumatic  Eyes:    PERRL, conjunctiva/corneas clear, EOM's intact both eyes       Ears:    Normal TM's and external ear canals, both ears  Nose:   Nares normal, septum midline, mucosa normal, no drainage   or sinus tenderness  Throat:   Lips, mucosa, and tongue normal; teeth  and gums normal  Neck:   Supple, symmetrical, trachea midline, no adenopathy;       thyroid:  No enlargement/tenderness/nodules  Back:     Symmetric, no curvature, ROM normal, no CVA tenderness  Lungs:     Clear to auscultation bilaterally, respirations unlabored  Chest wall:    No tenderness or deformity  Heart:    Regular rate and rhythm, S1 and S2 normal, no murmur, rub   or gallop  Abdomen:     Soft, non-tender, bowel sounds active all four quadrants,    no masses, no organomegaly  Genitalia:    Normal male without lesion, masses,discharge or tenderness  Rectal:    Deferred due to young age  Extremities:   Extremities normal, atraumatic, no cyanosis or edema  Pulses:   2+ and symmetric all extremities  Skin:   Skin color, texture, turgor normal, no rashes or lesions  Lymph nodes:   Cervical, supraclavicular, and axillary nodes normal  Neurologic:   CNII-XII intact. Normal strength, sensation and reflexes      throughout          Assessment & Plan:

## 2022-01-07 NOTE — Patient Instructions (Signed)
Follow up in 6 months to recheck sugar and cholesterol We'll notify you of your lab results and make any changes if needed Continue to work on healthy diet and regular exercise- you can do it! We'll call you to schedule your GI consultation for the colonoscopy Send me a copy of your eye exam once it's done Call with any questions or concerns Stay Safe!  Stay Healthy! Happy Retirement!!!

## 2022-01-08 NOTE — Progress Notes (Signed)
Informed pt of lab results  

## 2022-02-06 ENCOUNTER — Encounter: Payer: Self-pay | Admitting: Interventional Cardiology

## 2022-04-02 DIAGNOSIS — K080 Exfoliation of teeth due to systemic causes: Secondary | ICD-10-CM | POA: Diagnosis not present

## 2022-04-22 ENCOUNTER — Other Ambulatory Visit: Payer: Self-pay

## 2022-04-22 ENCOUNTER — Other Ambulatory Visit: Payer: Self-pay | Admitting: Interventional Cardiology

## 2022-04-22 DIAGNOSIS — E119 Type 2 diabetes mellitus without complications: Secondary | ICD-10-CM

## 2022-04-22 MED ORDER — METFORMIN HCL 500 MG PO TABS: 500.0000 mg | ORAL_TABLET | Freq: Two times a day (BID) | ORAL | 1 refills | Status: DC

## 2022-04-22 MED ORDER — ROSUVASTATIN CALCIUM 20 MG PO TABS: 20.0000 mg | ORAL_TABLET | Freq: Every day | ORAL | 0 refills | Status: DC

## 2022-04-23 ENCOUNTER — Other Ambulatory Visit: Payer: Self-pay

## 2022-04-23 MED ORDER — ROSUVASTATIN CALCIUM 20 MG PO TABS: 20.0000 mg | ORAL_TABLET | Freq: Every day | ORAL | 0 refills | Status: DC

## 2022-05-13 NOTE — Progress Notes (Unsigned)
Cardiology Office Note   Date:  05/14/2022   ID:  Eric Rosales, DOB June 04, 1955, MRN [id number]  PCP:  Midge Minium, MD    No chief complaint on file.  CAD  Wt Readings from Last 3 Encounters:  05/14/22 235 lb 12.8 oz (107 kg)  01/07/22 240 lb 6 oz (109 kg)  02/05/21 254 lb 12.8 oz (115.6 kg)       History of Present Illness: Eric Rosales is a 67 y.o. male  Who underwent a coronary intervention in 2012 with a drug-eluting stent to the proximal dominant LAD. Plavix resistant so Effient was used.  Procedure done by Dr. Lia Foyer.  Angina was present when he started walking the treadmill.  It felt like an indigestion with exercise that resolved with rest.  It was reproducible with exercise.    His angina was exercise induced nausea, relieved with rest.  THis prompted the cath.  He had a 3.5 x 20 Promus Element stent to the prox LAD.    We discussed stress testing in 2018, but he was feeling well so declined.   He got his COVID shots.     He works as an Chief Financial Officer at [employer].   He retired in 12/2021.    Denies : Chest pain. Dizziness. Leg edema. Nitroglycerin use. Orthopnea. Palpitations. Paroxysmal nocturnal dyspnea. Shortness of breath. Syncope.   Back to walking on the treadmill and no sx like what he had  prior to his stent.  He  goes for 35 minutes including a 3 minute jog, (> 6 mph).  Joining the [employer] as well.    Past Medical History:  Diagnosis Date   CAD (coronary artery disease)    January, 2012, DES proximal dominant LAD at the diagonal takeoff ( P2Y12-no inhibition by Plavix) Effient used   Easy bruisability    October, 2012   Hyperlipidemia    Weight gain    October, 2012    Past Surgical History:  Procedure Laterality Date   CORONARY STENT PLACEMENT  2012     Current Outpatient Medications  Medication Sig Dispense Refill   Ascorbic Acid (VITAMIN C) 500 MG tablet Take 500 mg by mouth daily.       aspirin 81 MG tablet Take 1 tablet (81 mg total) by  mouth daily.     CIALIS 20 MG tablet TAKE 1/2-1 TABLET BY MOUTH EVERY OTHER DAY AS NEEDED 6 tablet 2   fluticasone (FLONASE) 50 MCG/ACT nasal spray Place 2 sprays into both nostrils daily. 16 g 6   meclizine (ANTIVERT) 25 MG tablet Take 1 tablet (25 mg total) by mouth 3 (three) times daily as needed. 45 tablet 0   metFORMIN (GLUCOPHAGE) 500 MG tablet Take 1 tablet (500 mg total) by mouth 2 (two) times daily with a meal. 180 tablet 1   Multiple Vitamin (MULTIVITAMIN) capsule Take 1 capsule by mouth daily.       nitroGLYCERIN (NITROSTAT) 0.4 MG SL tablet Place 1 tablet (0.4 mg total) under the tongue every 5 (five) minutes as needed for chest pain. 3 DOSES MAX 30 tablet 1   rosuvastatin (CRESTOR) 20 MG tablet Take 1 tablet (20 mg total) by mouth daily. 30 tablet 0   Turmeric 500 MG CAPS Take 1,000 mg by mouth daily.     No current facility-administered medications for this visit.    Allergies:   Patient has no known allergies.    Social History:  The patient  reports that he has never  smoked. He has never used smokeless tobacco. He reports current alcohol use of about 1.0 standard drink of alcohol per week. He reports that he does not use drugs.   Family History:  The patient's family history includes Diabetes in his brother, mother, and sister; Hypertension in an other family member; Stroke in his mother.    ROS:  Please see the history of present illness.   Otherwise, review of systems are positive for more free time.   All other systems are reviewed and negative.    PHYSICAL EXAM: VS:  BP 134/80   Pulse 67   Ht 5' 10"$  (1.778 m)   Wt 235 lb 12.8 oz (107 kg)   SpO2 99%   BMI 33.83 kg/m  , BMI Body mass index is 33.83 kg/m. GEN: Well nourished, well developed, in no acute distress HEENT: normal Neck: no JVD, carotid bruits, or masses Cardiac: RRR; no murmurs, rubs, or gallops,no edema  Respiratory:  clear to auscultation bilaterally, normal work of breathing GI: soft, nontender,  nondistended, + BS MS: no deformity or atrophy Skin: warm and dry, no rash Neuro:  Strength and sensation are intact Psych: euthymic mood, full affect   EKG:   The ekg ordered today demonstrates NSR, slight inferior T wave changes   Recent Labs: 01/07/2022: ALT 47; BUN 12; Creatinine, Ser 1.10; Hemoglobin 14.2; Platelets 215.0; Potassium 4.5; Sodium 133; TSH 1.86   Lipid Panel    Component Value Date/Time   CHOL 139 01/07/2022 0930   CHOL 136 11/25/2021 1006   TRIG 124.0 01/07/2022 0930   HDL 41.70 01/07/2022 0930   HDL 45 11/25/2021 1006   CHOLHDL 3 01/07/2022 0930   VLDL 24.8 01/07/2022 0930   LDLCALC 72 01/07/2022 0930   LDLCALC 75 11/25/2021 1006   LDLDIRECT 139.5 03/19/2012 1059     Other studies Reviewed: Additional studies/ records that were reviewed today with results demonstrating: A1c 7.0 creatinine 1.1.   ASSESSMENT AND PLAN:  CAD: No angina. We discussed stress test.  Since he is feeling well, he would like to hold off.  If exercise tolerance changes, he will let us know. Hyperlipidemia: October 2023 total cholesterol 139 HDL 41 LDL 72 triglycerides 124. Tolerating Crestor.  Abnormal ECG: lateral T wave changes noted in the past as well. Elevated LFTs: noted in the past.  Back to normal.   Diabetes: The current medical regimen is effective;  continue present plan and medications. Whole food, plant based diet.  High fiber diet.  Avoid processed foods.  Morbid obesity: He has lost some weight over the course of 2023.   Current medicines are reviewed at length with the patient today.  The patient concerns regarding his medicines were addressed.  The following changes have been made:  No change  Labs/ tests ordered today include:  No orders of the defined types were placed in this encounter.   Recommend 150 minutes/week of aerobic exercise Low fat, low carb, high fiber diet recommended  Disposition:   FU in 1 year   Signed, Larae Grooms, MD   05/14/2022 4:28 PM    [facility] [address], [place], [place]  [address] Phone: [phone]; Fax: [phone]

## 2022-05-14 ENCOUNTER — Ambulatory Visit: Payer: Medicare Other | Attending: Interventional Cardiology | Admitting: Interventional Cardiology

## 2022-05-14 VITALS — BP 134/80 | HR 67 | Ht 70.0 in | Wt 235.8 lb

## 2022-05-14 DIAGNOSIS — R9431 Abnormal electrocardiogram [ECG] [EKG]: Secondary | ICD-10-CM

## 2022-05-14 DIAGNOSIS — I2510 Atherosclerotic heart disease of native coronary artery without angina pectoris: Secondary | ICD-10-CM | POA: Diagnosis not present

## 2022-05-14 DIAGNOSIS — R7989 Other specified abnormal findings of blood chemistry: Secondary | ICD-10-CM

## 2022-05-14 DIAGNOSIS — E782 Mixed hyperlipidemia: Secondary | ICD-10-CM | POA: Diagnosis not present

## 2022-05-14 NOTE — Patient Instructions (Signed)
Medication Instructions:  Your physician recommends that you continue on your current medications as directed. Please refer to the Current Medication list given to you today.  *If you need a refill on your cardiac medications before your next appointment, please call your pharmacy*   Lab Work: none If you have labs (blood work) drawn today and your tests are completely normal, you will receive your results only by: [place] (if you have MyChart) OR A paper copy in the mail If you have any lab test that is abnormal or we need to change your treatment, we will call you to review the results.   Testing/Procedures: none   Follow-Up: At [facility], you and your health needs are our priority.  As part of our continuing mission to provide you with exceptional heart care, we have created designated Provider Care Teams.  These Care Teams include your primary Cardiologist (physician) and Advanced Practice Providers (APPs -  Physician Assistants and Nurse Practitioners) who all work together to provide you with the care you need, when you need it.  We recommend signing up for the patient portal called "MyChart".  Sign up information is provided on this After Visit Summary.  MyChart is used to connect with patients for Virtual Visits (Telemedicine).  Patients are able to view lab/test results, encounter notes, upcoming appointments, etc.  Non-urgent messages can be sent to your provider as well.   To learn more about what you can do with MyChart, go to [email or web address].    Your next appointment:   12 month(s)  Provider:   Larae Grooms, MD     Other Instructions  High-Fiber Eating Plan Fiber, also called dietary fiber, is a type of carbohydrate. It is found foods such as fruits, vegetables, whole grains, and beans. A high-fiber diet can have many health benefits. Your health care provider may recommend a high-fiber diet to help: Prevent constipation. Fiber can make  your bowel movements more regular. Lower your cholesterol. Relieve the following conditions: Inflammation of veins in the anus (hemorrhoids). Inflammation of specific areas of the digestive tract (uncomplicated diverticulosis). A problem of the large intestine, also called the colon, that sometimes causes pain and diarrhea (irritable bowel syndrome, or IBS). Prevent overeating as part of a weight-loss plan. Prevent heart disease, type 2 diabetes, and certain cancers. What are tips for following this plan? Reading food labels  Check the nutrition facts label on food products for the amount of dietary fiber. Choose foods that have 5 grams of fiber or more per serving. The goals for recommended daily fiber intake include: Men (age 56 or younger): 34-38 g. Men (over age 77): 28-34 g. Women (age 56 or younger): 25-28 g. Women (over age 34): 22-25 g. Your daily fiber goal is _____________ g. Shopping Choose whole fruits and vegetables instead of processed forms, such as apple juice or applesauce. Choose a wide variety of high-fiber foods such as avocados, lentils, oats, and kidney beans. Read the nutrition facts label of the foods you choose. Be aware of foods with added fiber. These foods often have high sugar and sodium amounts per serving. Cooking Use whole-grain flour for baking and cooking. Cook with brown rice instead of white rice. Meal planning Start the day with a breakfast that is high in fiber, such as a cereal that contains 5 g of fiber or more per serving. Eat breads and cereals that are made with whole-grain flour instead of refined flour or white flour. Eat brown rice, bulgur wheat,  or millet instead of white rice. Use beans in place of meat in soups, salads, and pasta dishes. Be sure that half of the grains you eat each day are whole grains. General information You can get the recommended daily intake of dietary fiber by: Eating a variety of fruits, vegetables, grains,  nuts, and beans. Taking a fiber supplement if you are not able to take in enough fiber in your diet. It is better to get fiber through food than from a supplement. Gradually increase how much fiber you consume. If you increase your intake of dietary fiber too quickly, you may have bloating, cramping, or gas. Drink plenty of water to help you digest fiber. Choose high-fiber snacks, such as berries, raw vegetables, nuts, and popcorn. What foods should I eat? Fruits Berries. Pears. Apples. Oranges. Avocado. Prunes and raisins. Dried figs. Vegetables Sweet potatoes. Spinach. Kale. Artichokes. Cabbage. Broccoli. Cauliflower. Green peas. Carrots. Squash. Grains Whole-grain breads. Multigrain cereal. Oats and oatmeal. Brown rice. Barley. Bulgur wheat. [place]. Quinoa. Bran muffins. Popcorn. Rye wafer crackers. Meats and other proteins Navy beans, kidney beans, and pinto beans. Soybeans. Split peas. Lentils. Nuts and seeds. Dairy Fiber-fortified yogurt. Beverages Fiber-fortified soy milk. Fiber-fortified orange juice. Other foods Fiber bars. The items listed above may not be a complete list of recommended foods and beverages. Contact a dietitian for more information. What foods should I avoid? Fruits Fruit juice. Cooked, strained fruit. Vegetables Fried potatoes. Canned vegetables. Well-cooked vegetables. Grains White bread. Pasta made with refined flour. White rice. Meats and other proteins Fatty cuts of meat. Fried chicken or fried fish. Dairy Milk. Yogurt. Cream cheese. Sour cream. Fats and oils Butters. Beverages Soft drinks. Other foods Cakes and pastries. The items listed above may not be a complete list of foods and beverages to avoid. Talk with your dietitian about what choices are best for you. Summary Fiber is a type of carbohydrate. It is found in foods such as fruits, vegetables, whole grains, and beans. A high-fiber diet has many benefits. It can help to prevent  constipation, lower blood cholesterol, aid weight loss, and reduce your risk of heart disease, diabetes, and certain cancers. Increase your intake of fiber gradually. Increasing fiber too quickly may cause cramping, bloating, and gas. Drink plenty of water while you increase the amount of fiber you consume. The best sources of fiber include whole fruits and vegetables, whole grains, nuts, seeds, and beans. This information is not intended to replace advice given to you by your health care provider. Make sure you discuss any questions you have with your health care provider. Document Revised: 07/21/2019 Document Reviewed: 07/21/2019 Elsevier Patient Education  [place].

## 2022-05-29 ENCOUNTER — Other Ambulatory Visit: Payer: Medicare Other

## 2022-06-11 ENCOUNTER — Telehealth: Payer: Self-pay | Admitting: Family Medicine

## 2022-06-11 NOTE — Telephone Encounter (Signed)
Contacted Eric Rosales to schedule their annual wellness visit. Welcome to Medicare visit Due by 09/29/2022.  Thank you,  [name] Direct dial  [phone]

## 2022-08-06 ENCOUNTER — Encounter: Payer: Self-pay | Admitting: Family Medicine

## 2022-08-06 ENCOUNTER — Other Ambulatory Visit: Payer: Self-pay | Admitting: Interventional Cardiology

## 2022-08-07 ENCOUNTER — Other Ambulatory Visit: Payer: Self-pay

## 2022-08-07 DIAGNOSIS — E119 Type 2 diabetes mellitus without complications: Secondary | ICD-10-CM

## 2022-08-07 MED ORDER — ROSUVASTATIN CALCIUM 20 MG PO TABS: 20.0000 mg | ORAL_TABLET | Freq: Every day | ORAL | 3 refills | Status: DC

## 2022-08-07 MED ORDER — METFORMIN HCL 500 MG PO TABS
500.0000 mg | ORAL_TABLET | Freq: Two times a day (BID) | ORAL | 1 refills | Status: DC
Start: 2022-08-07 — End: 2023-01-22

## 2022-08-19 ENCOUNTER — Encounter: Payer: Self-pay | Admitting: Family Medicine

## 2022-08-29 ENCOUNTER — Telehealth: Payer: Self-pay | Admitting: Family Medicine

## 2022-08-29 NOTE — Telephone Encounter (Signed)
Left message for pt to call for appt.

## 2022-08-29 NOTE — Telephone Encounter (Signed)
Called and left pt another message.

## 2022-10-01 ENCOUNTER — Encounter: Payer: Medicare Other | Admitting: Family Medicine

## 2022-10-08 DIAGNOSIS — K080 Exfoliation of teeth due to systemic causes: Secondary | ICD-10-CM | POA: Diagnosis not present

## 2022-10-24 ENCOUNTER — Other Ambulatory Visit: Payer: Self-pay | Admitting: Family Medicine

## 2022-10-24 NOTE — Telephone Encounter (Signed)
Patient is requesting a refill of the following medications: Requested Prescriptions   Pending Prescriptions Disp Refills   meclizine (ANTIVERT) 25 MG tablet 45 tablet 0    Sig: Take 1 tablet (25 mg total) by mouth 3 (three) times daily as needed.    Date of patient request: 10/24/22 Last office visit: 01/07/22 Date of last refill: 11/25/19 Last refill amount: 45 Follow up time period per chart: 6 months

## 2022-10-27 ENCOUNTER — Encounter: Payer: Self-pay | Admitting: Family Medicine

## 2022-10-27 ENCOUNTER — Ambulatory Visit (INDEPENDENT_AMBULATORY_CARE_PROVIDER_SITE_OTHER): Payer: Medicare Other | Admitting: Family Medicine

## 2022-10-27 VITALS — BP 138/84 | HR 66 | Temp 97.8°F | Resp 18 | Ht 70.0 in | Wt 241.2 lb

## 2022-10-27 DIAGNOSIS — E782 Mixed hyperlipidemia: Secondary | ICD-10-CM

## 2022-10-27 DIAGNOSIS — R42 Dizziness and giddiness: Secondary | ICD-10-CM | POA: Diagnosis not present

## 2022-10-27 DIAGNOSIS — Z7984 Long term (current) use of oral hypoglycemic drugs: Secondary | ICD-10-CM

## 2022-10-27 DIAGNOSIS — E119 Type 2 diabetes mellitus without complications: Secondary | ICD-10-CM

## 2022-10-27 LAB — LIPID PANEL
Cholesterol: 139 mg/dL (ref 0–200)
HDL: 45.7 mg/dL (ref >39.00)
LDL Cholesterol: 67 mg/dL (ref 0–99)
NonHDL: 93.57
Total CHOL/HDL Ratio: 3
Triglycerides: 134 mg/dL (ref 0.0–149.0)
VLDL: 26.8 mg/dL (ref 0.0–40.0)

## 2022-10-27 LAB — HEPATIC FUNCTION PANEL
ALT: 31 U/L (ref 0–53)
AST: 21 U/L (ref 0–37)
Albumin: 4.4 g/dL (ref 3.5–5.2)
Alkaline Phosphatase: 73 U/L (ref 39–117)
Bilirubin, Direct: 0.1 mg/dL (ref 0.0–0.3)
Total Bilirubin: 0.6 mg/dL (ref 0.2–1.2)
Total Protein: 7.6 g/dL (ref 6.0–8.3)

## 2022-10-27 LAB — BASIC METABOLIC PANEL
BUN: 15 mg/dL (ref 6–23)
CO2: 28 mEq/L (ref 19–32)
Calcium: 9.8 mg/dL (ref 8.4–10.5)
Chloride: 102 mEq/L (ref 96–112)
Creatinine, Ser: 1.21 mg/dL (ref 0.40–1.50)
GFR: 62.23 mL/min (ref >60.00)
Glucose, Bld: 115 mg/dL — ABNORMAL HIGH (ref 70–99)
Potassium: 4.4 mEq/L (ref 3.5–5.1)
Sodium: 138 mEq/L (ref 135–145)

## 2022-10-27 LAB — CBC WITH DIFFERENTIAL/PLATELET
Basophils Absolute: 0 10*3/uL (ref 0.0–0.1)
Basophils Relative: 0.5 % (ref 0.0–3.0)
Eosinophils Absolute: 0.1 10*3/uL (ref 0.0–0.7)
Eosinophils Relative: 1.8 % (ref 0.0–5.0)
HCT: 41.7 % (ref 39.0–52.0)
Hemoglobin: 13.6 g/dL (ref 13.0–17.0)
Lymphocytes Relative: 24.6 % (ref 12.0–46.0)
Lymphs Abs: 1.8 10*3/uL (ref 0.7–4.0)
MCHC: 32.6 g/dL (ref 30.0–36.0)
MCV: 93.2 fl (ref 78.0–100.0)
Monocytes Absolute: 0.8 10*3/uL (ref 0.1–1.0)
Monocytes Relative: 11.3 % (ref 3.0–12.0)
Neutro Abs: 4.4 10*3/uL (ref 1.4–7.7)
Neutrophils Relative %: 61.8 % (ref 43.0–77.0)
Platelets: 172 10*3/uL (ref 150.0–400.0)
RBC: 4.47 Mil/uL (ref 4.22–5.81)
RDW: 15.7 % — ABNORMAL HIGH (ref 11.5–15.5)
WBC: 7.1 10*3/uL (ref 4.0–10.5)

## 2022-10-27 LAB — TSH: TSH: 2.53 u[IU]/mL (ref 0.35–5.50)

## 2022-10-27 LAB — MICROALBUMIN / CREATININE URINE RATIO
Creatinine,U: 194.7 mg/dL
Microalb Creat Ratio: 0.9 mg/g (ref 0.0–30.0)
Microalb, Ur: 1.7 mg/dL (ref 0.0–1.9)

## 2022-10-27 LAB — HEMOGLOBIN A1C: Hgb A1c MFr Bld: 6.6 % — ABNORMAL HIGH (ref 4.6–6.5)

## 2022-10-27 MED ORDER — MECLIZINE HCL 25 MG PO TABS: 25.0000 mg | ORAL_TABLET | Freq: Three times a day (TID) | ORAL | 0 refills | Status: DC | PRN

## 2022-10-27 NOTE — Assessment & Plan Note (Signed)
Chronic problem.  Pt is on Metformin 500mg  BID.  Overdue for f/u and A1C.  Due for eye exam- referral placed.  Now walking 5 miles/day, 5x/week.  Applauded his efforts.  Check labs.  Adjust meds prn

## 2022-10-27 NOTE — Assessment & Plan Note (Signed)
Chronic problem.  Currently on Crestor 20mg daily w/o difficulty.  Check labs.  Adjust meds prn  

## 2022-10-27 NOTE — Progress Notes (Signed)
   Subjective:    Patient ID: Eric Rosales, male    DOB: 1956-01-22, 67 y.o.   MRN: [id number]  HPI Vertigo- pt has hx of similar, last episode 3 yrs ago.  Pt woke Friday w/ dizziness.  Restarted his Flonase and Claritin on Friday and sxs are improving.  At the time was unable to lie on L side w/o triggering vertigo.  Mild associated nausea but no vomiting.    DM- chronic problem, on Metformin 500mg  BID.  Due for eye exam- searching for provider.  No symptomatic lows.  No CP, SOB, HA's, visual changes.  No numbness/tingling of hands/feet.   Walking 5 miles/day 5x/week  Hyperlipidemia- chronic problem, on Crestor 20mg  daily.  No abd pain, N/V.   Review of Systems For ROS see HPI     Objective:   Physical Exam Vitals reviewed.  Constitutional:      General: He is not in acute distress.    Appearance: Normal appearance. He is well-developed. He is obese. He is not ill-appearing.  HENT:     Head: Normocephalic and atraumatic.     Right Ear: Tympanic membrane and ear canal normal.     Left Ear: Ear canal normal. A middle ear effusion is present.  Eyes:     Extraocular Movements: Extraocular movements intact.     Conjunctiva/sclera: Conjunctivae normal.     Pupils: Pupils are equal, round, and reactive to light.  Neck:     Thyroid: No thyromegaly.  Cardiovascular:     Rate and Rhythm: Normal rate and regular rhythm.     Pulses: Normal pulses.     Heart sounds: Normal heart sounds. No murmur heard. Pulmonary:     Effort: Pulmonary effort is normal. No respiratory distress.     Breath sounds: Normal breath sounds.  Abdominal:     General: Bowel sounds are normal. There is no distension.     Palpations: Abdomen is soft.  Musculoskeletal:     Cervical back: Normal range of motion and neck supple.     Right lower leg: No edema.     Left lower leg: No edema.  Lymphadenopathy:     Cervical: No cervical adenopathy.  Skin:    General: Skin is warm and dry.  Neurological:     General:  No focal deficit present.     Mental Status: He is alert and oriented to person, place, and time.     Cranial Nerves: No cranial nerve deficit.  Psychiatric:        Mood and Affect: Mood normal.        Behavior: Behavior normal.           Assessment & Plan:

## 2022-10-27 NOTE — Assessment & Plan Note (Signed)
Pt has hx of similar but last episode was years ago.  He reports he had some ear discomfort on Thursday and then Friday woke w/ vertigo.  Restarted Claritin and Flonase and sxs have improved.  Refill of Meclizine to use prn provided.  Encouraged continued use of allergy meds as small L middle ear effusion present.  Pt expressed understanding and is in agreement w/ plan.

## 2022-10-27 NOTE — Patient Instructions (Signed)
Schedule your complete physical in 6 months We'll notify you of your lab results and make any changes if needed Continue the Claritin and Flonase.  Use the Meclizine as needed We'll call you to schedule your eye exam Keep up the good work on healthy diet and regular exercise- you're doing great!!! Call with any questions or concerns Stay Safe!  Stay Healthy! Enjoy the rest of your summer!!!

## 2022-10-28 ENCOUNTER — Telehealth: Payer: Self-pay

## 2022-10-28 NOTE — Telephone Encounter (Signed)
-----   Message from Neena Rhymes sent at 10/28/2022  7:17 AM EDT ----- Labs look AMAZING!!!!  Keep up the good work!

## 2022-10-28 NOTE — Telephone Encounter (Signed)
Pt is aware of lab results.

## 2022-11-11 DIAGNOSIS — E119 Type 2 diabetes mellitus without complications: Secondary | ICD-10-CM | POA: Diagnosis not present

## 2022-11-11 DIAGNOSIS — H35033 Hypertensive retinopathy, bilateral: Secondary | ICD-10-CM | POA: Diagnosis not present

## 2022-11-11 DIAGNOSIS — H25813 Combined forms of age-related cataract, bilateral: Secondary | ICD-10-CM | POA: Diagnosis not present

## 2022-11-11 DIAGNOSIS — H35372 Puckering of macula, left eye: Secondary | ICD-10-CM | POA: Diagnosis not present

## 2022-11-11 DIAGNOSIS — H524 Presbyopia: Secondary | ICD-10-CM | POA: Diagnosis not present

## 2022-11-11 LAB — HM DIABETES EYE EXAM

## 2022-12-17 ENCOUNTER — Ambulatory Visit (INDEPENDENT_AMBULATORY_CARE_PROVIDER_SITE_OTHER): Payer: Medicare Other | Admitting: *Deleted

## 2022-12-17 DIAGNOSIS — Z0000 Encounter for general adult medical examination without abnormal findings: Secondary | ICD-10-CM

## 2022-12-17 DIAGNOSIS — Z1211 Encounter for screening for malignant neoplasm of colon: Secondary | ICD-10-CM

## 2022-12-17 NOTE — Progress Notes (Signed)
Subjective:   Eric Rosales is a 67 y.o. male who presents for an Initial Medicare Annual Wellness Visit.  Visit Complete: Virtual  I connected with  Eric Rosales on 12/17/22 by a audio enabled telemedicine application and verified that I am speaking with the correct person using two identifiers.  Patient Location: Home  Provider Location: Home Office  I discussed the limitations of evaluation and management by telemedicine. The patient expressed understanding and agreed to proceed.  Vital Signs: Unable to obtain new vitals due to this being a telehealth visit.   Patient Medicare AWV questionnaire was completed by the patient on 12-16-2022; I have confirmed that all information answered by patient is correct and no changes since this date.  Cardiac Risk Factors include: advanced age (>78men, >34 women);diabetes mellitus;male gender     Objective:    There were no vitals filed for this visit. There is no height or weight on file to calculate BMI.     12/17/2022    1:29 PM  Advanced Directives  Does Patient Have a Medical Advance Directive? No  Would patient like information on creating a medical advance directive? No - Patient declined    Current Medications (verified) Outpatient Encounter Medications as of 12/17/2022  Medication Sig   Ascorbic Acid (VITAMIN C) 500 MG tablet Take 500 mg by mouth daily.     aspirin 81 MG tablet Take 1 tablet (81 mg total) by mouth daily.   CIALIS 20 MG tablet TAKE 1/2-1 TABLET BY MOUTH EVERY OTHER DAY AS NEEDED   fluticasone (FLONASE) 50 MCG/ACT nasal spray Place 2 sprays into both nostrils daily.   meclizine (ANTIVERT) 25 MG tablet Take 1 tablet (25 mg total) by mouth 3 (three) times daily as needed.   metFORMIN (GLUCOPHAGE) 500 MG tablet Take 1 tablet (500 mg total) by mouth 2 (two) times daily with a meal.   Multiple Vitamin (MULTIVITAMIN) capsule Take 1 capsule by mouth daily.     nitroGLYCERIN (NITROSTAT) 0.4 MG SL tablet Place 1 tablet (0.4 mg  total) under the tongue every 5 (five) minutes as needed for chest pain. 3 DOSES MAX   rosuvastatin (CRESTOR) 20 MG tablet Take 1 tablet (20 mg total) by mouth daily.   Turmeric 500 MG CAPS Take 1,000 mg by mouth daily.   No facility-administered encounter medications on file as of 12/17/2022.    Allergies (verified) Patient has no known allergies.   History: Past Medical History:  Diagnosis Date   CAD (coronary artery disease)    January, 2012, DES proximal dominant LAD at the diagonal takeoff ( P2Y12-no inhibition by Plavix) Effient used   Easy bruisability    October, 2012   Hyperlipidemia    Weight gain    October, 2012   Past Surgical History:  Procedure Laterality Date   CORONARY STENT PLACEMENT  2012   Family History  Problem Relation Age of Onset   Diabetes Mother    Stroke Mother    Hypertension Other    Diabetes Sister    Diabetes Brother    Heart attack Neg Hx    Social History   Socioeconomic History   Marital status: Married    Spouse name: Not on file   Number of children: Not on file   Years of education: Not on file   Highest education level: Not on file  Occupational History   Not on file  Tobacco Use   Smoking status: Never   Smokeless tobacco: Never  Vaping Use  Vaping status: Never Used  Substance and Sexual Activity   Alcohol use: Yes    Alcohol/week: 1.0 standard drink of alcohol    Types: 1 Glasses of wine per week   Drug use: No   Sexual activity: Not on file  Other Topics Concern   Not on file  Social History Narrative   Not on file   Social Determinants of Health   Financial Resource Strain: Low Risk  (12/17/2022)   Overall Financial Resource Strain (CARDIA)    Difficulty of Paying Living Expenses: Not hard at all  Food Insecurity: No Food Insecurity (12/17/2022)   Hunger Vital Sign    Worried About Running Out of Food in the Last Year: Never true    Ran Out of Food in the Last Year: Never true  Transportation Needs: No  Transportation Needs (12/17/2022)   PRAPARE - Administrator, Civil Service (Medical): No    Lack of Transportation (Non-Medical): No  Physical Activity: Sufficiently Active (12/17/2022)   Exercise Vital Sign    Days of Exercise per Week: 5 days    Minutes of Exercise per Session: 90 min  Stress: No Stress Concern Present (12/17/2022)   [employer] of Occupational Health - Occupational Stress Questionnaire    Feeling of Stress : Not at all  Social Connections: Socially Integrated (12/17/2022)   Social Connection and Isolation Panel [NHANES]    Frequency of Communication with Friends and Family: Twice a week    Frequency of Social Gatherings with Friends and Family: Once a week    Attends Religious Services: More than 4 times per year    Active Member of [employer] or Organizations: Yes    Attends Engineer, structural: More than 4 times per year    Marital Status: Married    Tobacco Counseling Counseling given: Not Answered   Clinical Intake:  Pre-visit preparation completed: Yes  Pain : No/denies pain     Diabetes: Yes CBG done?: No Did pt. bring in CBG monitor from home?: No  How often do you need to have someone help you when you read instructions, pamphlets, or other written materials from your doctor or pharmacy?: 1 - Never  Interpreter Needed?: No  Information entered by :: Remi Haggard LPN   Activities of Daily Living    12/17/2022    1:31 PM 12/16/2022    8:01 PM  In your present state of health, do you have any difficulty performing the following activities:  Hearing? 0 0  Vision? 0 0  Difficulty concentrating or making decisions? 0 0  Walking or climbing stairs? 0 0  Dressing or bathing? 0 0  Doing errands, shopping? 0 0  Preparing Food and eating ? N N  Using the Toilet? N N  In the past six months, have you accidently leaked urine? N N  Do you have problems with loss of bowel control? N N  Managing your Medications? N N  Managing  your Finances? N N  Housekeeping or managing your Housekeeping?  N    Patient Care Team: Sheliah Hatch, MD as PCP - General Corky Crafts, MD as PCP - Cardiology (Cardiology)  Indicate any recent Medical Services you may have received from other than [facility] providers in the past year (date may be approximate).     Assessment:   This is a routine wellness examination for [facility].  Hearing/Vision screen Hearing Screening - Comments:: No trouble hearing Vision Screening - Comments:: Up to date [facility]  Goals Addressed             This Visit's Progress    Weight (lb) < 200 lb (90.7 kg)       225 lbs       Depression Screen    12/17/2022    1:32 PM 10/27/2022    8:23 AM 01/07/2022    9:13 AM 01/03/2021   12:35 PM 11/25/2019    1:36 PM 09/22/2017   11:11 AM 05/06/2017    2:56 PM  PHQ 2/9 Scores  PHQ - 2 Score 0 0 0 0 0 0 0  PHQ- 9 Score 0 0 0 0 0 0 0    Fall Risk    12/17/2022    1:42 PM 12/16/2022    8:01 PM 10/27/2022    8:24 AM 09/28/2022    8:18 PM 01/07/2022    9:13 AM  Fall Risk   Falls in the past year? 0 0 0 0 0  Number falls in past yr: 0 0 0 0   Injury with Fall? 0 0 0 0   Risk for fall due to :   No Fall Risks  No Fall Risks  Follow up Education provided;Falls evaluation completed;Falls prevention discussed  Falls evaluation completed      MEDICARE RISK AT HOME: Medicare Risk at Home Any stairs in or around the home?: (P) Yes If so, are there any without handrails?: (P) No Home free of loose throw rugs in walkways, pet beds, electrical cords, etc?: (P) Yes Adequate lighting in your home to reduce risk of falls?: (P) Yes Life alert?: (P) No Use of a cane, walker or w/c?: (P) No Grab bars in the bathroom?: (P) No Shower chair or bench in shower?: (P) Yes Elevated toilet seat or a handicapped toilet?: (P) No  TIMED UP AND GO:  Was the test performed? No    Cognitive Function:        12/17/2022    1:30 PM  6CIT Screen  What Year? 0  points  What month? 0 points  What time? 0 points  Count back from 20 0 points  Months in reverse 0 points  Repeat phrase 0 points  Total Score 0 points    Immunizations Immunization History  Administered Date(s) Administered   Influenza, High Dose Seasonal PF 01/07/2022   Influenza,inj,quad, With Preservative 01/16/2018   Moderna Sars-Covid-2 Vaccination 08/01/2019, 09/05/2019, 03/25/2020   Tdap 01/16/2005, 05/10/2015    TDAP status: Up to date  Flu Vaccine status: Due, Education has been provided regarding the importance of this vaccine. Advised may receive this vaccine at local pharmacy or Health Dept. Aware to provide a copy of the vaccination record if obtained from local pharmacy or Health Dept. Verbalized acceptance and understanding.  Pneumococcal vaccine status: Due, Education has been provided regarding the importance of this vaccine. Advised may receive this vaccine at local pharmacy or Health Dept. Aware to provide a copy of the vaccination record if obtained from local pharmacy or Health Dept. Verbalized acceptance and understanding.  Covid-19 vaccine status: Information provided on how to obtain vaccines.   Qualifies for Shingles Vaccine? Yes   Zostavax completed No   Shingrix Completed?: No.    Education has been provided regarding the importance of this vaccine. Patient has been advised to call insurance company to determine out of pocket expense if they have not yet received this vaccine. Advised may also receive vaccine at local pharmacy or Health Dept. Verbalized acceptance and understanding.  Screening  Tests Health Maintenance  Topic Date Due   Pneumonia Vaccine 67+ Years old (1 of 1 - PCV) 01/08/2023 (Originally 12/12/2020)   Zoster Vaccines- Shingrix (1 of 2) 01/27/2023 (Originally 12/13/1974)   INFLUENZA VACCINE  06/29/2023 (Originally 10/30/2022)   Colonoscopy  03/31/2048 (Originally 11/09/2012)   FOOT EXAM  01/08/2023   HEMOGLOBIN A1C  04/29/2023   Diabetic  kidney evaluation - eGFR measurement  10/27/2023   Diabetic kidney evaluation - Urine ACR  10/27/2023   OPHTHALMOLOGY EXAM  11/11/2023   Medicare Annual Wellness (AWV)  12/17/2023   DTaP/Tdap/Td (3 - Td or Tdap) 05/09/2025   HPV VACCINES  Aged Out   COVID-19 Vaccine  Discontinued   Hepatitis C Screening  Discontinued    Health Maintenance  There are no preventive care reminders to display for this patient.   Colorectal cancer screening: Referral to GI placed  . Pt aware the office will call re: appt.  Lung Cancer Screening: (Low Dose CT Chest recommended if Age 31-80 years, 20 pack-year currently smoking OR have quit w/in 15years.) does not qualify.   Lung Cancer Screening Referral:   Additional Screening:  Hepatitis C Screening  never done  Vision Screening: Recommended annual ophthalmology exams for early detection of glaucoma and other disorders of the eye. Is the patient up to date with their annual eye exam?  Yes  Who is the provider or what is the name of the office in which the patient attends annual eye exams? [facility] If pt is not established with a provider, would they like to be referred to a provider to establish care? No .   Dental Screening: Recommended annual dental exams for proper oral hygiene  Nutrition Risk Assessment:  Has the patient had any N/V/D within the last 2 months?  No  Does the patient have any non-healing wounds?  No  Has the patient had any unintentional weight loss or weight gain?  No   Diabetes:  Is the patient diabetic?  Yes  If diabetic, was a CBG obtained today?  No  Did the patient bring in their glucometer from home?  No  How often do you monitor your CBG's? Does not check at home.   Financial Strains and Diabetes Management:  Are you having any financial strains with the device, your supplies or your medication? No .  Does the patient want to be seen by Chronic Care Management for management of their diabetes?  No  Would the  patient like to be referred to a Nutritionist or for Diabetic Management?  No   Diabetic Exams:  Diabetic Eye Exam: Completed .Pt has been advised about the importance in completing this exam.   Diabetic Foot Exam: . Pt has been advised about the importance in completing this exam  Community Resource Referral / Chronic Care Management: CRR required this visit?  No   CCM required this visit?  No    Plan:     I have personally reviewed and noted the following in the patient's chart:   Medical and social history Use of alcohol, tobacco or illicit drugs  Current medications and supplements including opioid prescriptions. Patient is not currently taking opioid prescriptions. Functional ability and status Nutritional status Physical activity Advanced directives List of other physicians Hospitalizations, surgeries, and ER visits in previous 12 months Vitals Screenings to include cognitive, depression, and falls Referrals and appointments  In addition, I have reviewed and discussed with patient certain preventive protocols, quality metrics, and best practice recommendations. A written personalized  care plan for preventive services as well as general preventive health recommendations were provided to patient.     Remi Haggard, LPN   07/07/8117   After Visit Summary: (MyChart) Due to this being a telephonic visit, the after visit summary with patients personalized plan was offered to patient via MyChart   Nurse Notes:

## 2022-12-17 NOTE — Patient Instructions (Signed)
Mr. Walko , Thank you for taking time to come for your Medicare Wellness Visit. I appreciate your ongoing commitment to your health goals. Please review the following plan we discussed and let me know if I can assist you in the future.   Screening recommendations/referrals: Colonoscopy: ordered Recommended yearly ophthalmology/optometry visit for glaucoma screening and checkup Recommended yearly dental visit for hygiene and checkup  Vaccinations: Influenza vaccine: Education provided Pneumococcal vaccine:  Tdap vaccine: up to date Shingles vaccine:     Advanced directives: Education provided    Preventive Care 65 Years and Older, Male Preventive care refers to lifestyle choices and visits with your health care provider that can promote health and wellness. What does preventive care include? A yearly physical exam. This is also called an annual well check. Dental exams once or twice a year. Routine eye exams. Ask your health care provider how often you should have your eyes checked. Personal lifestyle choices, including: Daily care of your teeth and gums. Regular physical activity. Eating a healthy diet. Avoiding tobacco and drug use. Limiting alcohol use. Practicing safe sex. Taking low doses of aspirin every day. Taking vitamin and mineral supplements as recommended by your health care provider. What happens during an annual well check? The services and screenings done by your health care provider during your annual well check will depend on your age, overall health, lifestyle risk factors, and family history of disease. Counseling  Your health care provider may ask you questions about your: Alcohol use. Tobacco use. Drug use. Emotional well-being. Home and relationship well-being. Sexual activity. Eating habits. History of falls. Memory and ability to understand (cognition). Work and work Astronomer. Screening  You may have the following tests or  measurements: Height, weight, and BMI. Blood pressure. Lipid and cholesterol levels. These may be checked every 5 years, or more frequently if you are over 47 years old. Skin check. Lung cancer screening. You may have this screening every year starting at age 91 if you have a 30-pack-year history of smoking and currently smoke or have quit within the past 15 years. Fecal occult blood test (FOBT) of the stool. You may have this test every year starting at age 74. Flexible sigmoidoscopy or colonoscopy. You may have a sigmoidoscopy every 5 years or a colonoscopy every 10 years starting at age 50. Prostate cancer screening. Recommendations will vary depending on your family history and other risks. Hepatitis C blood test. Hepatitis B blood test. Sexually transmitted disease (STD) testing. Diabetes screening. This is done by checking your blood sugar (glucose) after you have not eaten for a while (fasting). You may have this done every 1-3 years. Abdominal aortic aneurysm (AAA) screening. You may need this if you are a current or former smoker. Osteoporosis. You may be screened starting at age 3 if you are at high risk. Talk with your health care provider about your test results, treatment options, and if necessary, the need for more tests. Vaccines  Your health care provider may recommend certain vaccines, such as: Influenza vaccine. This is recommended every year. Tetanus, diphtheria, and acellular pertussis (Tdap, Td) vaccine. You may need a Td booster every 10 years. Zoster vaccine. You may need this after age 57. Pneumococcal 13-valent conjugate (PCV13) vaccine. One dose is recommended after age 44. Pneumococcal polysaccharide (PPSV23) vaccine. One dose is recommended after age 68. Talk to your health care provider about which screenings and vaccines you need and how often you need them. This information is not intended to replace  advice given to you by your health care provider. Make sure  you discuss any questions you have with your health care provider. Document Released: 04/13/2015 Document Revised: 12/05/2015 Document Reviewed: 01/16/2015 Elsevier Interactive Patient Education  2017 [employer].  Fall Prevention in the Home Falls can cause injuries. They can happen to people of all ages. There are many things you can do to make your home safe and to help prevent falls. What can I do on the outside of my home? Regularly fix the edges of walkways and driveways and fix any cracks. Remove anything that might make you trip as you walk through a door, such as a raised step or threshold. Trim any bushes or trees on the path to your home. Use bright outdoor lighting. Clear any walking paths of anything that might make someone trip, such as rocks or tools. Regularly check to see if handrails are loose or broken. Make sure that both sides of any steps have handrails. Any raised decks and porches should have guardrails on the edges. Have any leaves, snow, or ice cleared regularly. Use sand or salt on walking paths during winter. Clean up any spills in your garage right away. This includes oil or grease spills. What can I do in the bathroom? Use night lights. Install grab bars by the toilet and in the tub and shower. Do not use towel bars as grab bars. Use non-skid mats or decals in the tub or shower. If you need to sit down in the shower, use a plastic, non-slip stool. Keep the floor dry. Clean up any water that spills on the floor as soon as it happens. Remove soap buildup in the tub or shower regularly. Attach bath mats securely with double-sided non-slip rug tape. Do not have throw rugs and other things on the floor that can make you trip. What can I do in the bedroom? Use night lights. Make sure that you have a light by your bed that is easy to reach. Do not use any sheets or blankets that are too big for your bed. They should not hang down onto the floor. Have a firm  chair that has side arms. You can use this for support while you get dressed. Do not have throw rugs and other things on the floor that can make you trip. What can I do in the kitchen? Clean up any spills right away. Avoid walking on wet floors. Keep items that you use a lot in easy-to-reach places. If you need to reach something above you, use a strong step stool that has a grab bar. Keep electrical cords out of the way. Do not use floor polish or wax that makes floors slippery. If you must use wax, use non-skid floor wax. Do not have throw rugs and other things on the floor that can make you trip. What can I do with my stairs? Do not leave any items on the stairs. Make sure that there are handrails on both sides of the stairs and use them. Fix handrails that are broken or loose. Make sure that handrails are as long as the stairways. Check any carpeting to make sure that it is firmly attached to the stairs. Fix any carpet that is loose or worn. Avoid having throw rugs at the top or bottom of the stairs. If you do have throw rugs, attach them to the floor with carpet tape. Make sure that you have a light switch at the top of the stairs and the bottom  of the stairs. If you do not have them, ask someone to add them for you. What else can I do to help prevent falls? Wear shoes that: Do not have high heels. Have rubber bottoms. Are comfortable and fit you well. Are closed at the toe. Do not wear sandals. If you use a stepladder: Make sure that it is fully opened. Do not climb a closed stepladder. Make sure that both sides of the stepladder are locked into place. Ask someone to hold it for you, if possible. Clearly mark and make sure that you can see: Any grab bars or handrails. First and last steps. Where the edge of each step is. Use tools that help you move around (mobility aids) if they are needed. These include: Canes. Walkers. Scooters. Crutches. Turn on the lights when you go  into a dark area. Replace any light bulbs as soon as they burn out. Set up your furniture so you have a clear path. Avoid moving your furniture around. If any of your floors are uneven, fix them. If there are any pets around you, be aware of where they are. Review your medicines with your doctor. Some medicines can make you feel dizzy. This can increase your chance of falling. Ask your doctor what other things that you can do to help prevent falls. This information is not intended to replace advice given to you by your health care provider. Make sure you discuss any questions you have with your health care provider. Document Released: 01/11/2009 Document Revised: 08/23/2015 Document Reviewed: 04/21/2014 Elsevier Interactive Patient Education  2017 [employer].

## 2023-01-14 ENCOUNTER — Ambulatory Visit (INDEPENDENT_AMBULATORY_CARE_PROVIDER_SITE_OTHER): Payer: Medicare Other | Admitting: Family Medicine

## 2023-01-14 ENCOUNTER — Encounter: Payer: Self-pay | Admitting: Family Medicine

## 2023-01-14 ENCOUNTER — Encounter: Payer: Medicare Other | Admitting: Family Medicine

## 2023-01-14 VITALS — BP 132/82 | HR 69 | Temp 97.8°F | Ht 69.0 in | Wt 237.1 lb

## 2023-01-14 DIAGNOSIS — E119 Type 2 diabetes mellitus without complications: Secondary | ICD-10-CM

## 2023-01-14 DIAGNOSIS — Z0000 Encounter for general adult medical examination without abnormal findings: Secondary | ICD-10-CM

## 2023-01-14 DIAGNOSIS — Z23 Encounter for immunization: Secondary | ICD-10-CM | POA: Diagnosis not present

## 2023-01-14 DIAGNOSIS — Z125 Encounter for screening for malignant neoplasm of prostate: Secondary | ICD-10-CM | POA: Diagnosis not present

## 2023-01-14 DIAGNOSIS — Z7984 Long term (current) use of oral hypoglycemic drugs: Secondary | ICD-10-CM

## 2023-01-14 NOTE — Assessment & Plan Note (Signed)
Ongoing issue for pt.  Currently on Metformin w/o difficulty.  UTD on eye exam, microalbumin.  Foot exam done today.  Check labs.  Adjust meds prn.

## 2023-01-14 NOTE — Patient Instructions (Signed)
Follow up in 6 months to recheck blood pressure, cholesterol, and sugar We'll notify you of your lab results and make any changes if needed Continue to work on healthy diet and regular exercise- you're doing great!!! Schedule your colonoscopy!! Call with any questions or concerns Stay Safe!  Stay Healthy! Happy Fall!!!

## 2023-01-14 NOTE — Assessment & Plan Note (Signed)
Pt's PE WNL w/ exception of BMI.  Due for colonoscopy- pt to schedule.  Declines PNA shot.  Flu shot given.  Check labs.  Anticipatory guidance provided.

## 2023-01-14 NOTE — Progress Notes (Signed)
   Subjective:    Patient ID: Eric Rosales, male    DOB: 11/25/1955, 67 y.o.   MRN: [id number]  HPI CPE- UTD on eye exam, microalbumin.  Due for foot exam.  Wants to get flu today.  Declines PNA, shingles.  Due for colon cancer screening- pt to schedule.  Patient Care Team    Relationship Specialty Notifications Start End  Sheliah Hatch, MD PCP - General   03/13/10   Corky Crafts, MD PCP - Cardiology Cardiology Admissions 10/20/18   Mateo Flow, MD Consulting Physician Ophthalmology  01/14/23     Health Maintenance  Topic Date Due   Hepatitis C Screening  Never done   Zoster Vaccines- Shingrix (1 of 2) Never done   Colonoscopy  11/09/2012   Pneumonia Vaccine 80+ Years old (1 of 1 - PCV) Never done   INFLUENZA VACCINE  10/30/2022   COVID-19 Vaccine (4 - 2023-24 season) 11/30/2022   FOOT EXAM  01/08/2023   HEMOGLOBIN A1C  04/29/2023   Diabetic kidney evaluation - eGFR measurement  10/27/2023   Diabetic kidney evaluation - Urine ACR  10/27/2023   OPHTHALMOLOGY EXAM  11/11/2023   Medicare Annual Wellness (AWV)  12/17/2023   DTaP/Tdap/Td (3 - Td or Tdap) 05/09/2025   HPV VACCINES  Aged Out      Review of Systems Patient reports no vision/hearing changes, anorexia, fever ,adenopathy, persistant/recurrent hoarseness, swallowing issues, chest pain, palpitations, edema, persistant/recurrent cough, hemoptysis, dyspnea (rest,exertional, paroxysmal nocturnal), gastrointestinal  bleeding (melena, rectal bleeding), abdominal pain, excessive heart burn, GU symptoms (dysuria, hematuria, voiding/incontinence issues) syncope, focal weakness, memory loss, numbness & tingling, skin/hair/nail changes, depression, anxiety, abnormal bruising/bleeding, musculoskeletal symptoms/signs.     Objective:   Physical Exam General Appearance:    Alert, cooperative, no distress, appears stated age  Head:    Normocephalic, without obvious abnormality, atraumatic  Eyes:    PERRL, conjunctiva/corneas  clear, EOM's intact both eyes       Ears:    Normal TM's and external ear canals, both ears  Nose:   Nares normal, septum midline, mucosa normal, no drainage   or sinus tenderness  Throat:   Lips, mucosa, and tongue normal; teeth and gums normal  Neck:   Supple, symmetrical, trachea midline, no adenopathy;       thyroid:  No enlargement/tenderness/nodules  Back:     Symmetric, no curvature, ROM normal, no CVA tenderness  Lungs:     Clear to auscultation bilaterally, respirations unlabored  Chest wall:    No tenderness or deformity  Heart:    Regular rate and rhythm, S1 and S2 normal, no murmur, rub   or gallop  Abdomen:     Soft, non-tender, bowel sounds active all four quadrants,    no masses, no organomegaly  Genitalia:    deferred  Rectal:    Extremities:   Extremities normal, atraumatic, no cyanosis or edema  Pulses:   2+ and symmetric all extremities  Skin:   Skin color, texture, turgor normal, no rashes or lesions  Lymph nodes:   Cervical, supraclavicular, and axillary nodes normal  Neurologic:   CNII-XII intact. Normal strength, sensation and reflexes      throughout          Assessment & Plan:

## 2023-01-15 LAB — BASIC METABOLIC PANEL
BUN: 13 mg/dL (ref 6–23)
CO2: 29 meq/L (ref 19–32)
Calcium: 9.7 mg/dL (ref 8.4–10.5)
Chloride: 103 meq/L (ref 96–112)
Creatinine, Ser: 1.16 mg/dL (ref 0.40–1.50)
GFR: 65.37 mL/min (ref >60.00)
Glucose, Bld: 95 mg/dL (ref 70–99)
Potassium: 4.6 meq/L (ref 3.5–5.1)
Sodium: 140 meq/L (ref 135–145)

## 2023-01-15 LAB — HEPATIC FUNCTION PANEL
ALT: 28 U/L (ref 0–53)
AST: 20 U/L (ref 0–37)
Albumin: 4.4 g/dL (ref 3.5–5.2)
Alkaline Phosphatase: 76 U/L (ref 39–117)
Bilirubin, Direct: 0.1 mg/dL (ref 0.0–0.3)
Total Bilirubin: 0.7 mg/dL (ref 0.2–1.2)
Total Protein: 7.4 g/dL (ref 6.0–8.3)

## 2023-01-15 LAB — CBC WITH DIFFERENTIAL/PLATELET
Basophils Absolute: 0.1 10*3/uL (ref 0.0–0.1)
Basophils Relative: 1 % (ref 0.0–3.0)
Eosinophils Absolute: 0.2 10*3/uL (ref 0.0–0.7)
Eosinophils Relative: 2.2 % (ref 0.0–5.0)
HCT: 44.7 % (ref 39.0–52.0)
Hemoglobin: 14.2 g/dL (ref 13.0–17.0)
Lymphocytes Relative: 28 % (ref 12.0–46.0)
Lymphs Abs: 2 10*3/uL (ref 0.7–4.0)
MCHC: 31.7 g/dL (ref 30.0–36.0)
MCV: 93.6 fL (ref 78.0–100.0)
Monocytes Absolute: 0.8 10*3/uL (ref 0.1–1.0)
Monocytes Relative: 11 % (ref 3.0–12.0)
Neutro Abs: 4.1 10*3/uL (ref 1.4–7.7)
Neutrophils Relative %: 57.8 % (ref 43.0–77.0)
Platelets: 173 10*3/uL (ref 150.0–400.0)
RBC: 4.78 Mil/uL (ref 4.22–5.81)
RDW: 15.7 % — ABNORMAL HIGH (ref 11.5–15.5)
WBC: 7.1 10*3/uL (ref 4.0–10.5)

## 2023-01-15 LAB — LIPID PANEL
Cholesterol: 151 mg/dL (ref 0–200)
HDL: 46.1 mg/dL (ref >39.00)
LDL Cholesterol: 78 mg/dL (ref 0–99)
NonHDL: 104.9
Total CHOL/HDL Ratio: 3
Triglycerides: 137 mg/dL (ref 0.0–149.0)
VLDL: 27.4 mg/dL (ref 0.0–40.0)

## 2023-01-15 LAB — TSH: TSH: 1.94 u[IU]/mL (ref 0.35–5.50)

## 2023-01-15 LAB — PSA, MEDICARE: PSA: 0.65 ng/mL (ref 0.10–4.00)

## 2023-01-15 LAB — HEMOGLOBIN A1C: Hgb A1c MFr Bld: 6.6 % — ABNORMAL HIGH (ref 4.6–6.5)

## 2023-01-22 ENCOUNTER — Other Ambulatory Visit: Payer: Self-pay | Admitting: Family Medicine

## 2023-01-22 DIAGNOSIS — E119 Type 2 diabetes mellitus without complications: Secondary | ICD-10-CM

## 2023-02-23 ENCOUNTER — Encounter: Payer: Medicare Other | Admitting: Family Medicine

## 2023-04-07 ENCOUNTER — Other Ambulatory Visit: Payer: Self-pay | Admitting: Family

## 2023-04-07 ENCOUNTER — Encounter: Payer: Self-pay | Admitting: Family Medicine

## 2023-04-07 MED ORDER — TADALAFIL 20 MG PO TABS: 20.0000 mg | ORAL_TABLET | Freq: Every day | ORAL | 2 refills | Status: AC | PRN

## 2023-04-21 DIAGNOSIS — K080 Exfoliation of teeth due to systemic causes: Secondary | ICD-10-CM | POA: Diagnosis not present

## 2023-05-22 NOTE — Progress Notes (Signed)
 Cardiology Office Note   Date:  05/29/2023   ID:  Eric Rosales, DOB [birth year], MRN [id number]  PCP:  Sheliah Hatch, MD    Chief Complaint  Patient presents with   Coronary artery disease involving native coronary artery of   Follow-up    1 year   CAD  Wt Readings from Last 3 Encounters:  05/29/23 237 lb (107.5 kg)  01/14/23 237 lb 2 oz (107.6 kg)  10/27/22 241 lb 4 oz (109.4 kg)       History of Present Illness: Eric Rosales is a 68 y.o. male  new to me previously seen by Dr Eldridge Dace. He had coronary intervention in 2012 with a drug-eluting stent to the proximal dominant LAD (3.5 x 20 Promus Element stent) . Plavix resistant so Effient was used.  Procedure done by Dr. Riley Kill.  Angina was present when he started walking the treadmill.  It felt like an indigestion with exercise that resolved with rest.  It was reproducible with exercise.    3.5 x 20 Promus Element stent    He retired  Art gallery manager in 12/2021.    Denies : Chest pain. Dizziness. Leg edema. Nitroglycerin use. Orthopnea. Palpitations. Paroxysmal nocturnal dyspnea. Shortness of breath. Syncope.   Back to walking on the treadmill and no sx like what he had  prior to his stent.  He  goes for 35 minutes including a 3 minute jog, (> 6 mph).  Joining the [employer] as well.   Wife had Alene Mires but has recovered Both walking a lot now and he is getting Ready for golf season   Past Medical History:  Diagnosis Date   CAD (coronary artery disease)    January, 2012, DES proximal dominant LAD at the diagonal takeoff ( P2Y12-no inhibition by Plavix) Effient used   Easy bruisability    October, 2012   Hyperlipidemia    Weight gain    October, 2012    Past Surgical History:  Procedure Laterality Date   CORONARY STENT PLACEMENT  2012     Current Outpatient Medications  Medication Sig Dispense Refill   Ascorbic Acid (VITAMIN C) 500 MG tablet Take 500 mg by mouth daily.       aspirin 81 MG tablet Take 1 tablet  (81 mg total) by mouth daily.     fluticasone (FLONASE) 50 MCG/ACT nasal spray Place 2 sprays into both nostrils daily. 16 g 6   metFORMIN (GLUCOPHAGE) 500 MG tablet TAKE 1 TABLET TWICE A DAY WITH MEALS 180 tablet 3   Multiple Vitamin (MULTIVITAMIN) capsule Take 1 capsule by mouth daily.       nitroGLYCERIN (NITROSTAT) 0.4 MG SL tablet Place 1 tablet (0.4 mg total) under the tongue every 5 (five) minutes as needed for chest pain. 3 DOSES MAX 30 tablet 1   rosuvastatin (CRESTOR) 20 MG tablet Take 1 tablet (20 mg total) by mouth daily. 90 tablet 3   tadalafil (CIALIS) 20 MG tablet Take 1 tablet (20 mg total) by mouth daily as needed for erectile dysfunction. 6 tablet 2   Turmeric 500 MG CAPS Take 1,000 mg by mouth daily.     No current facility-administered medications for this visit.    Allergies:   Patient has no known allergies.    Social History:  The patient  reports that he has never smoked. He has never used smokeless tobacco. He reports current alcohol use of about 1.0 standard drink of alcohol per week. He reports that he does not  use drugs.   Family History:  The patient's family history includes Diabetes in his brother, mother, and sister; Hypertension in an other family member; Stroke in his mother.    ROS:  Please see the history of present illness.   Otherwise, review of systems are positive for more free time.   All other systems are reviewed and negative.    PHYSICAL EXAM: VS:  BP 130/80 (BP Location: Left Arm, Patient Position: Sitting, Cuff Size: Large)   Pulse 71   Resp 16   Ht 5\' 9"  (1.753 m)   Wt 237 lb (107.5 kg)   SpO2 98%   BMI 35.00 kg/m  , BMI Body mass index is 35 kg/m.  Affect appropriate Healthy:  appears stated age HEENT: normal Neck supple with no adenopathy JVP normal no bruits no thyromegaly Lungs clear with no wheezing and good diaphragmatic motion Heart:  S1/S2 no murmur, no rub, gallop or click PMI normal Abdomen: benighn, BS positve, no  tenderness, no AAA no bruit.  No HSM or HJR Distal pulses intact with no bruits No edema Neuro non-focal Skin warm and dry No muscular weakness    EKG:   05/29/2023 NSR nonspecific ST T wave changes    Recent Labs: 01/14/2023: ALT 28; BUN 13; Creatinine, Ser 1.16; Hemoglobin 14.2; Platelets 173.0; Potassium 4.6; Sodium 140; TSH 1.94   Lipid Panel    Component Value Date/Time   CHOL 151 01/14/2023 1324   CHOL 136 11/25/2021 1006   TRIG 137.0 01/14/2023 1324   HDL 46.10 01/14/2023 1324   HDL 45 11/25/2021 1006   CHOLHDL 3 01/14/2023 1324   VLDL 27.4 01/14/2023 1324   LDLCALC 78 01/14/2023 1324   LDLCALC 75 11/25/2021 1006   LDLDIRECT 139.5 03/19/2012 1059     Other studies Reviewed: Additional studies/ records that were reviewed today with results demonstrating: A1c 7.0 creatinine 1.1.   ASSESSMENT AND PLAN:  CAD: No angina. Distant stent to LAD in 2012 Active with no angina defers stress testing  Hyperlipidemia: Tolerating crestor LDL 78 01/14/23  Abnormal ECG: lateral T wave changes noted in the past as well. Elevated LFTs: noted in the past.  Back to normal. 01/14/23   Diabetes: On glucophage A1c 6.6  01/14/23 Discussed adding Jardiance  Morbid obesity: He has lost some weight over the course of 2023.   Current medicines are reviewed at length with the patient today.  The patient concerns regarding his medicines were addressed.  The following changes have been made:  None  Labs/ tests ordered today include:   Orders Placed This Encounter  Procedures   EKG 12-Lead    Recommend 150 minutes/week of aerobic exercise Low fat, low carb, high fiber diet recommended  Disposition:   FU in 1 year   Signed, Charlton Haws, MD  05/29/2023 8:20 AM    [facility] [address] [place], [place], [place]  [address] Phone: [phone]; Fax: [phone]

## 2023-05-29 ENCOUNTER — Ambulatory Visit: Payer: Medicare Other | Attending: Cardiovascular Disease | Admitting: Cardiovascular Disease

## 2023-05-29 ENCOUNTER — Encounter: Payer: Self-pay | Admitting: Cardiovascular Disease

## 2023-05-29 VITALS — BP 130/80 | HR 71 | Resp 16 | Ht 69.0 in | Wt 237.0 lb

## 2023-05-29 DIAGNOSIS — I2510 Atherosclerotic heart disease of native coronary artery without angina pectoris: Secondary | ICD-10-CM

## 2023-05-29 DIAGNOSIS — E782 Mixed hyperlipidemia: Secondary | ICD-10-CM

## 2023-05-29 NOTE — Patient Instructions (Addendum)

## 2023-07-21 ENCOUNTER — Other Ambulatory Visit: Payer: Self-pay | Admitting: Interventional Cardiology

## 2023-10-29 DIAGNOSIS — K080 Exfoliation of teeth due to systemic causes: Secondary | ICD-10-CM | POA: Diagnosis not present

## 2023-11-11 DIAGNOSIS — H25813 Combined forms of age-related cataract, bilateral: Secondary | ICD-10-CM | POA: Diagnosis not present

## 2023-11-11 DIAGNOSIS — H35033 Hypertensive retinopathy, bilateral: Secondary | ICD-10-CM | POA: Diagnosis not present

## 2023-11-11 DIAGNOSIS — E119 Type 2 diabetes mellitus without complications: Secondary | ICD-10-CM | POA: Diagnosis not present

## 2023-11-11 DIAGNOSIS — H35372 Puckering of macula, left eye: Secondary | ICD-10-CM | POA: Diagnosis not present

## 2023-11-11 LAB — HM DIABETES EYE EXAM

## 2023-12-25 NOTE — Progress Notes (Signed)
 Eric Rosales                                          MRN: [id number]   12/25/2023   The [facility] Quality Team Specialist reviewed this patient medical record for the purposes of chart review for care gap closure. The following were reviewed: chart review for care gap closure-kidney health evaluation for diabetes:eGFR  and uACR.    [facility] Quality Team

## 2024-01-19 ENCOUNTER — Other Ambulatory Visit: Payer: Self-pay | Admitting: Family Medicine

## 2024-01-19 DIAGNOSIS — E119 Type 2 diabetes mellitus without complications: Secondary | ICD-10-CM

## 2024-01-19 NOTE — Telephone Encounter (Signed)
 Pt has upcoming appt 02/2024. Please advise on refill. Last office visit 12/2022

## 2024-01-21 LAB — LAB REPORT - SCANNED
A1c: 6.6
EGFR: 84

## 2024-03-16 ENCOUNTER — Ambulatory Visit: Payer: Self-pay | Admitting: Family Medicine

## 2024-03-16 ENCOUNTER — Ambulatory Visit: Admitting: Family Medicine

## 2024-03-16 ENCOUNTER — Encounter: Payer: Self-pay | Admitting: Family Medicine

## 2024-03-16 VITALS — BP 132/84 | HR 69 | Temp 98.0°F | Ht 69.0 in | Wt 238.6 lb

## 2024-03-16 DIAGNOSIS — Z0000 Encounter for general adult medical examination without abnormal findings: Secondary | ICD-10-CM

## 2024-03-16 DIAGNOSIS — E119 Type 2 diabetes mellitus without complications: Secondary | ICD-10-CM | POA: Diagnosis not present

## 2024-03-16 DIAGNOSIS — Z7984 Long term (current) use of oral hypoglycemic drugs: Secondary | ICD-10-CM | POA: Diagnosis not present

## 2024-03-16 DIAGNOSIS — Z23 Encounter for immunization: Secondary | ICD-10-CM

## 2024-03-16 DIAGNOSIS — Z1211 Encounter for screening for malignant neoplasm of colon: Secondary | ICD-10-CM

## 2024-03-16 DIAGNOSIS — Z125 Encounter for screening for malignant neoplasm of prostate: Secondary | ICD-10-CM

## 2024-03-16 DIAGNOSIS — Z1159 Encounter for screening for other viral diseases: Secondary | ICD-10-CM

## 2024-03-16 LAB — LIPID PANEL
Cholesterol: 138 mg/dL (ref 28–200)
HDL: 48.7 mg/dL (ref >39.00)
LDL Cholesterol: 69 mg/dL (ref 10–99)
NonHDL: 89.74
Total CHOL/HDL Ratio: 3
Triglycerides: 102 mg/dL (ref 10.0–149.0)
VLDL: 20.4 mg/dL (ref 0.0–40.0)

## 2024-03-16 LAB — HEPATIC FUNCTION PANEL
ALT: 29 U/L (ref 3–53)
AST: 21 U/L (ref 5–37)
Albumin: 4.4 g/dL (ref 3.5–5.2)
Alkaline Phosphatase: 79 U/L (ref 39–117)
Bilirubin, Direct: 0.1 mg/dL (ref 0.1–0.3)
Total Bilirubin: 0.6 mg/dL (ref 0.2–1.2)
Total Protein: 7.4 g/dL (ref 6.0–8.3)

## 2024-03-16 LAB — CBC WITH DIFFERENTIAL/PLATELET
Basophils Absolute: 0 K/uL (ref 0.0–0.1)
Basophils Relative: 0.7 % (ref 0.0–3.0)
Eosinophils Absolute: 0.1 K/uL (ref 0.0–0.7)
Eosinophils Relative: 2 % (ref 0.0–5.0)
HCT: 41.6 % (ref 39.0–52.0)
Hemoglobin: 13.8 g/dL (ref 13.0–17.0)
Lymphocytes Relative: 21.6 % (ref 12.0–46.0)
Lymphs Abs: 1.5 K/uL (ref 0.7–4.0)
MCHC: 33.3 g/dL (ref 30.0–36.0)
MCV: 91.2 fl (ref 78.0–100.0)
Monocytes Absolute: 0.7 K/uL (ref 0.1–1.0)
Monocytes Relative: 10.2 % (ref 3.0–12.0)
Neutro Abs: 4.4 K/uL (ref 1.4–7.7)
Neutrophils Relative %: 65.5 % (ref 43.0–77.0)
Platelets: 180 K/uL (ref 150.0–400.0)
RBC: 4.56 Mil/uL (ref 4.22–5.81)
RDW: 15.9 % — ABNORMAL HIGH (ref 11.5–15.5)
WBC: 6.7 K/uL (ref 4.0–10.5)

## 2024-03-16 LAB — BASIC METABOLIC PANEL WITH GFR
BUN: 14 mg/dL (ref 6–23)
CO2: 31 meq/L (ref 19–32)
Calcium: 9.5 mg/dL (ref 8.4–10.5)
Chloride: 101 meq/L (ref 96–112)
Creatinine, Ser: 1.25 mg/dL (ref 0.40–1.50)
GFR: 59.27 mL/min — ABNORMAL LOW (ref >60.00)
Glucose, Bld: 120 mg/dL — ABNORMAL HIGH (ref 70–99)
Potassium: 4.2 meq/L (ref 3.5–5.1)
Sodium: 138 meq/L (ref 135–145)

## 2024-03-16 LAB — HEMOGLOBIN A1C: Hgb A1c MFr Bld: 6.7 % — ABNORMAL HIGH (ref 4.6–6.5)

## 2024-03-16 LAB — MICROALBUMIN / CREATININE URINE RATIO
Creatinine,U: 189.5 mg/dL
Microalb Creat Ratio: 11 mg/g (ref 0.0–30.0)
Microalb, Ur: 2.1 mg/dL — ABNORMAL HIGH (ref 0.7–1.9)

## 2024-03-16 LAB — TSH: TSH: 2.18 u[IU]/mL (ref 0.35–5.50)

## 2024-03-16 LAB — PSA, MEDICARE: PSA: 0.59 ng/mL (ref 0.10–4.00)

## 2024-03-16 NOTE — Progress Notes (Signed)
° °  Subjective:    Patient ID: Eric Rosales, male    DOB: May 15, 1955, 68 y.o.   MRN: [id number]  HPI CPE- UTD on foot exam, eye exam, Tdap.  Due for microalbumin, colon cancer screen.  Wants flu shot today.  Health Maintenance  Topic Date Due   Diabetic kidney evaluation - Urine ACR  Never done   Hepatitis C Screening  Never done   Zoster Vaccines- Shingrix (1 of 2) Never done   Colonoscopy  11/09/2012   Medicare Annual Wellness (AWV)  12/17/2023   FOOT EXAM  01/14/2024   COVID-19 Vaccine (4 - 2025-26 season) 04/01/2024 (Originally 11/30/2023)   Pneumococcal Vaccine: 50+ Years (1 of 2 - PCV) 04/08/2024 (Originally 12/13/1974)   HEMOGLOBIN A1C  07/21/2024   OPHTHALMOLOGY EXAM  11/10/2024   Diabetic kidney evaluation - eGFR measurement  01/20/2025   DTaP/Tdap/Td (3 - Td or Tdap) 05/09/2025   Influenza Vaccine  Completed   Meningococcal B Vaccine  Aged Out     Patient Care Team    Relationship Specialty Notifications Start End  Mahlon Comer BRAVO, MD PCP - General   03/13/10   Delford Maude BROCKS, MD PCP - Cardiology Cardiology  05/29/23   Cleatus Collar, MD Consulting Physician Ophthalmology  01/14/23       Review of Systems Patient reports no vision/hearing changes, anorexia, fever ,adenopathy, persistant/recurrent hoarseness, swallowing issues, chest pain, palpitations, edema, persistant/recurrent cough, hemoptysis, dyspnea (rest,exertional, paroxysmal nocturnal), gastrointestinal  bleeding (melena, rectal bleeding), abdominal pain, excessive heart burn, GU symptoms (dysuria, hematuria, voiding/incontinence issues) syncope, focal weakness, memory loss, numbness & tingling, skin/hair/nail changes, depression, anxiety, abnormal bruising/bleeding, musculoskeletal symptoms/signs.     Objective:   Physical Exam General Appearance:    Alert, cooperative, no distress, appears stated age  Head:    Normocephalic, without obvious abnormality, atraumatic  Eyes:    PERRL, conjunctiva/corneas clear,  EOM's intact, fundi    benign, both eyes       Ears:    Normal TM's and external ear canals, both ears  Nose:   Nares normal, septum midline, mucosa normal, no drainage   or sinus tenderness  Throat:   Lips, mucosa, and tongue normal; teeth and gums normal  Neck:   Supple, symmetrical, trachea midline, no adenopathy;       thyroid :  No enlargement/tenderness/nodules  Back:     Symmetric, no curvature, ROM normal, no CVA tenderness  Lungs:     Clear to auscultation bilaterally, respirations unlabored  Chest wall:    No tenderness or deformity  Heart:    Regular rate and rhythm, S1 and S2 normal, no murmur, rub   or gallop  Abdomen:     Soft, non-tender, bowel sounds active all four quadrants,    no masses, no organomegaly  Genitalia:    Normal male without lesion, masses,discharge or tenderness  Rectal:    Deferred due to young age  Extremities:   Extremities normal, atraumatic, no cyanosis or edema  Pulses:   2+ and symmetric all extremities  Skin:   Skin color, texture, turgor normal, no rashes or lesions  Lymph nodes:   Cervical, supraclavicular, and axillary nodes normal  Neurologic:   CNII-XII intact. Normal strength, sensation and reflexes      throughout          Assessment & Plan:

## 2024-03-16 NOTE — Assessment & Plan Note (Signed)
 Pt's PE WNL w/ exception of BMI.  UTD on foot exam, eye exam, Tdap.  Microalbumin ordered.  Referral for colonoscopy placed.  Flu shot given.  Check labs.  Anticipatory guidance provided.

## 2024-03-16 NOTE — Patient Instructions (Signed)
 Follow up in 6 months to recheck sugar, blood pressure, and cholesterol We'll notify you of your lab results and make any changes if needed Keep up the good work on healthy diet and regular exercise- you look great! We'll call you to schedule your colonoscopy Call with any questions or concerns Stay Safe!  Stay Healthy! Happy Holidays!!

## 2024-03-17 NOTE — Assessment & Plan Note (Signed)
 Chronic problem.  UTD on foot exam, eye exam.  Microalbumin ordered.  Check labs.  Adjust tx plan prn.

## 2024-03-17 NOTE — Assessment & Plan Note (Signed)
 Pt's BMI is 35.24 but along w/ his other medical issues- DM, hyperlipidemia, CAD- this qualifies as morbidly obese.  Encouraged low carb diet and regular exercise. Will follow.

## 2024-03-19 LAB — HEPATITIS C ANTIBODY: Hepatitis C Ab: NONREACTIVE

## 2024-04-18 ENCOUNTER — Other Ambulatory Visit: Payer: Self-pay | Admitting: Family Medicine

## 2024-04-18 DIAGNOSIS — E119 Type 2 diabetes mellitus without complications: Secondary | ICD-10-CM

## 2024-05-04 ENCOUNTER — Encounter: Payer: Self-pay | Admitting: Gastroenterology

## 2024-07-13 ENCOUNTER — Ambulatory Visit: Admitting: Cardiovascular Disease

## 2024-09-14 ENCOUNTER — Ambulatory Visit: Admitting: Family Medicine
